# Patient Record
Sex: Male | Born: 1975 | Race: White | Hispanic: No | Marital: Married | Smoking: Current every day smoker
Health system: Southern US, Community
[De-identification: ages and names within clinical notes are randomized; demographics above are authoritative.]

## PROBLEM LIST (undated history)

## (undated) DIAGNOSIS — T7840XA Allergy, unspecified, initial encounter: Secondary | ICD-10-CM

## (undated) DIAGNOSIS — K4090 Unilateral inguinal hernia, without obstruction or gangrene, not specified as recurrent: Secondary | ICD-10-CM

## (undated) HISTORY — DX: Allergy, unspecified, initial encounter: T78.40XA

## (undated) HISTORY — DX: Unilateral inguinal hernia, without obstruction or gangrene, not specified as recurrent: K40.90

---
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
## 1996-04-05 DIAGNOSIS — K4090 Unilateral inguinal hernia, without obstruction or gangrene, not specified as recurrent: Secondary | ICD-10-CM

## 1996-04-05 HISTORY — DX: Unilateral inguinal hernia, without obstruction or gangrene, not specified as recurrent: K40.90

## 1996-04-05 HISTORY — PX: HERNIA REPAIR: SHX51

## 2000-08-30 ENCOUNTER — Emergency Department (HOSPITAL_COMMUNITY): Admission: EM | Admit: 2000-08-30 | Discharge: 2000-08-31 | Payer: Self-pay | Admitting: Emergency Medicine

## 2004-08-12 ENCOUNTER — Ambulatory Visit: Payer: Self-pay | Admitting: Internal Medicine

## 2005-05-14 ENCOUNTER — Emergency Department (HOSPITAL_COMMUNITY): Admission: EM | Admit: 2005-05-14 | Discharge: 2005-05-14 | Payer: Self-pay | Admitting: Emergency Medicine

## 2007-03-24 IMAGING — US US SCROTUM
1 series · 14 of 25 positions shown · non-contrast
Comparison: none

CLINICAL DATA: Left testicular pain.  
 SCROTAL ULTRASOUND:

 DOPPLER ULTRASOUND OF THE TESTICLES:
TECHNIQUE: Complete ultrasound examination of the testicles, epididymis, and other scrotal structures was performed.  Color and spectral Doppler ultrasound were also utilized to evaluate blood flow to the testicles.

[Series 1: unknown · 0.09mm/px · 14 of 45 slices shown]
[im 1/45]
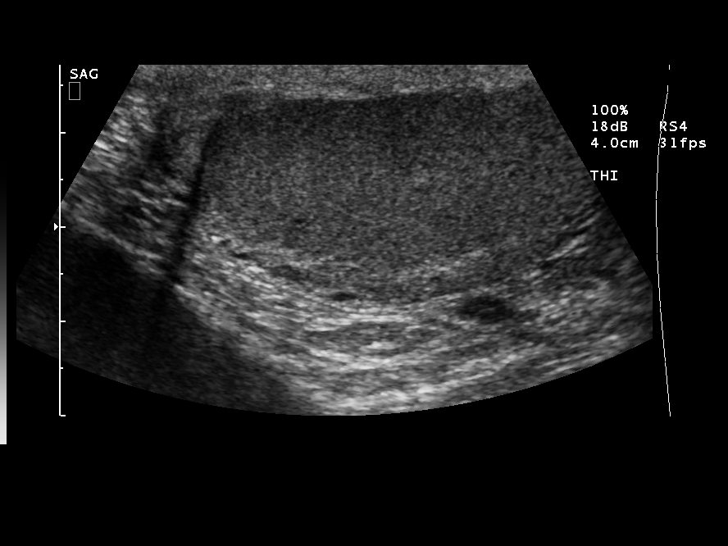
[im 4/45]
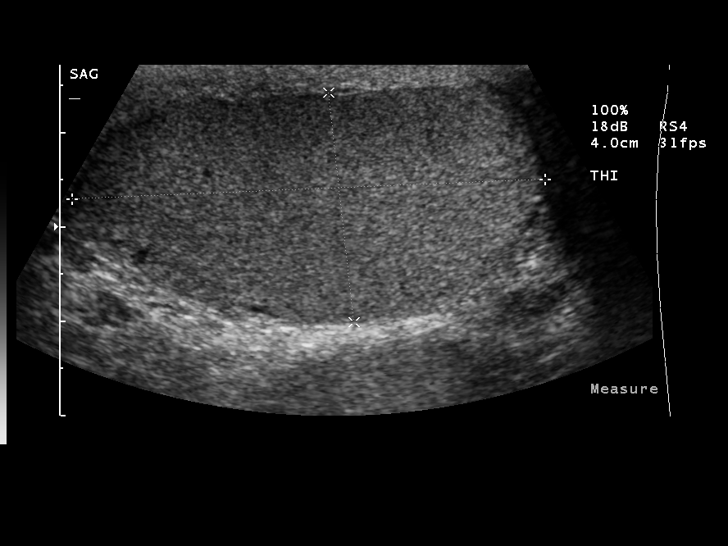
[im 8/45]
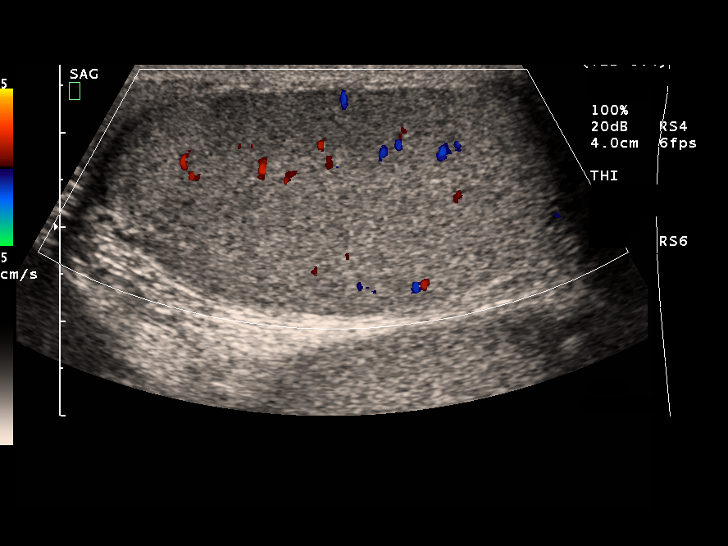
[im 12/45]
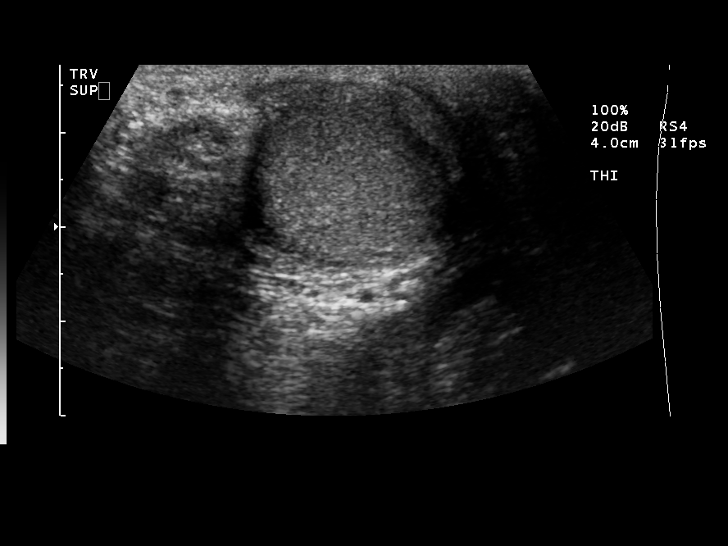
[im 15/45]
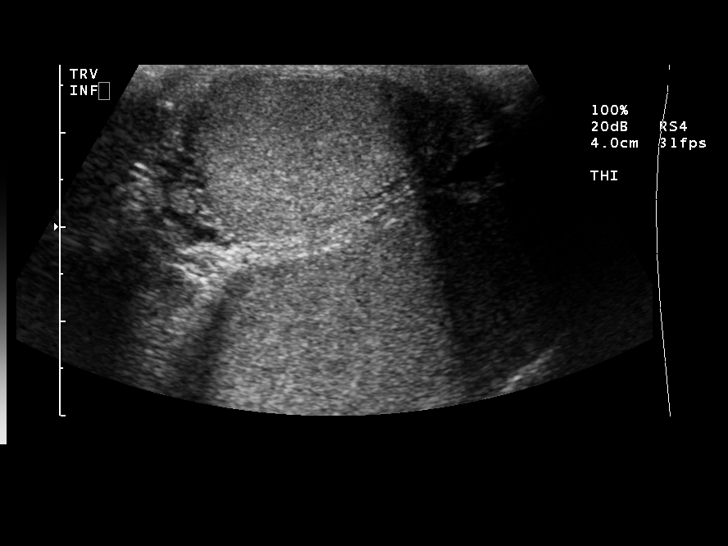
[im 17/45]
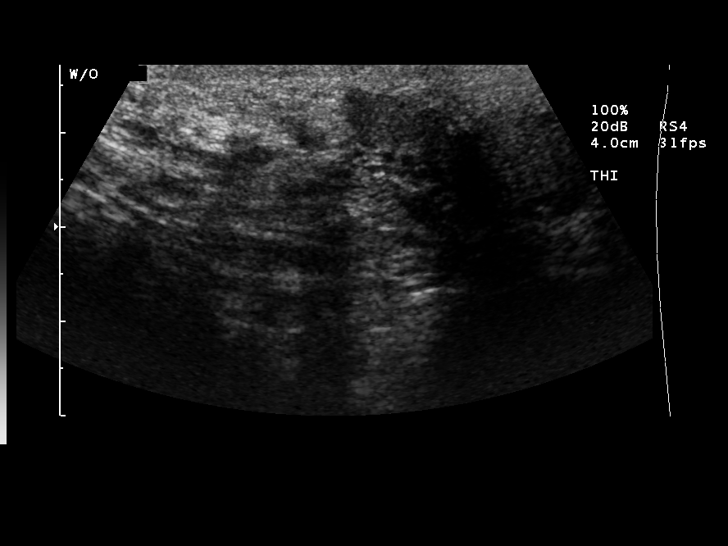
[im 21/45]
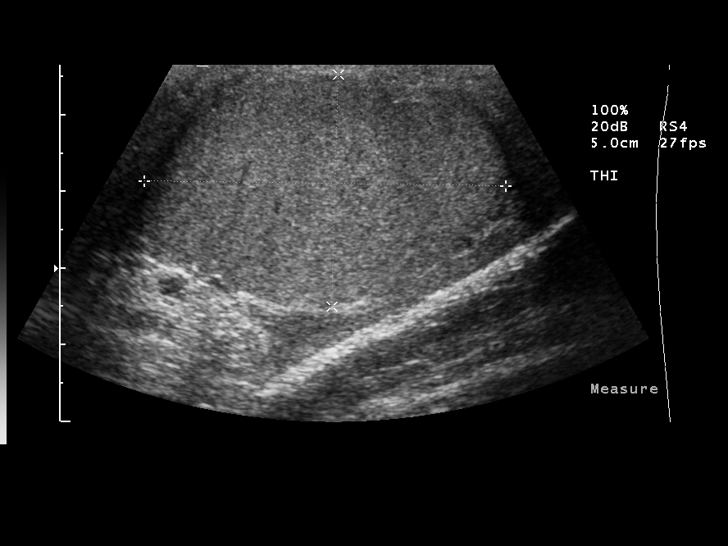
[im 24/45]
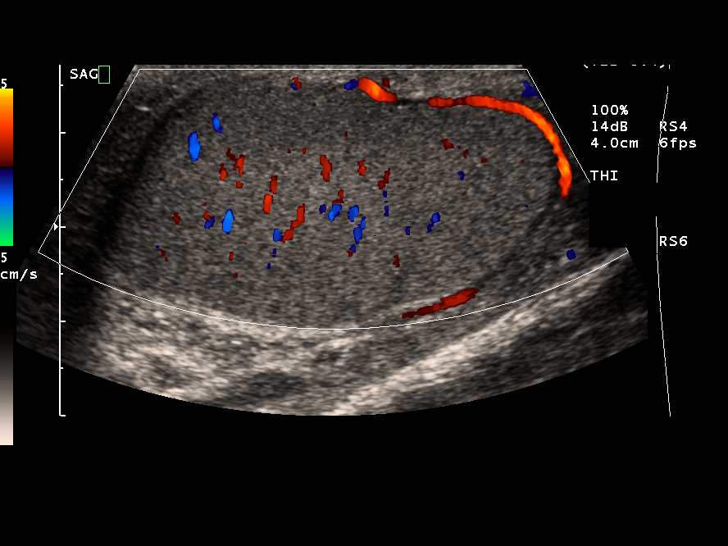
[im 28/45]
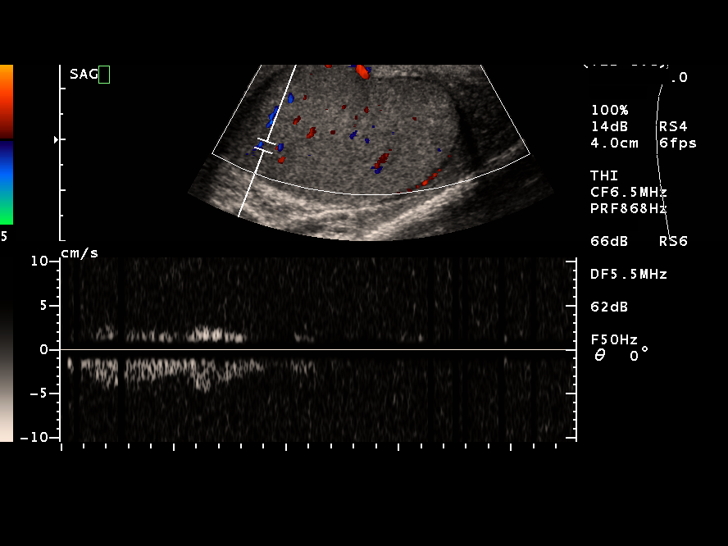
[im 30/45]
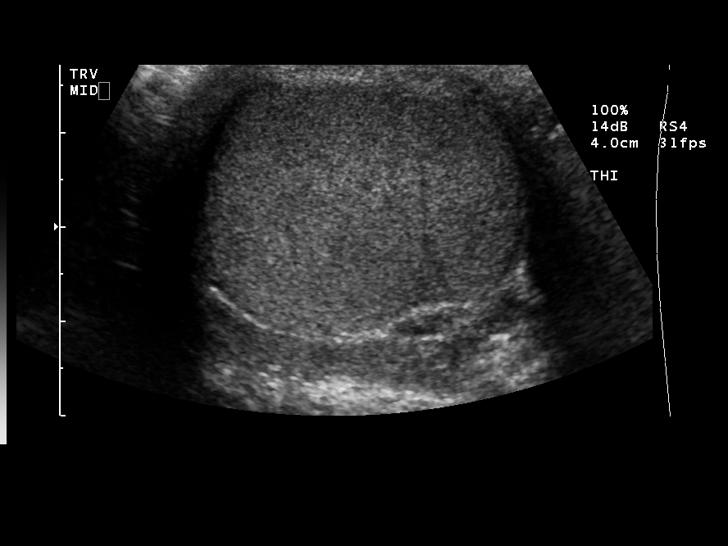
[im 34/45]
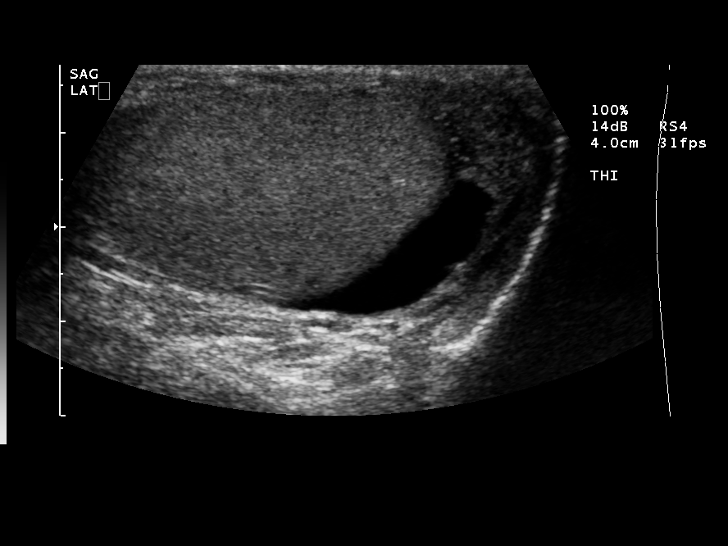
[im 37/45]
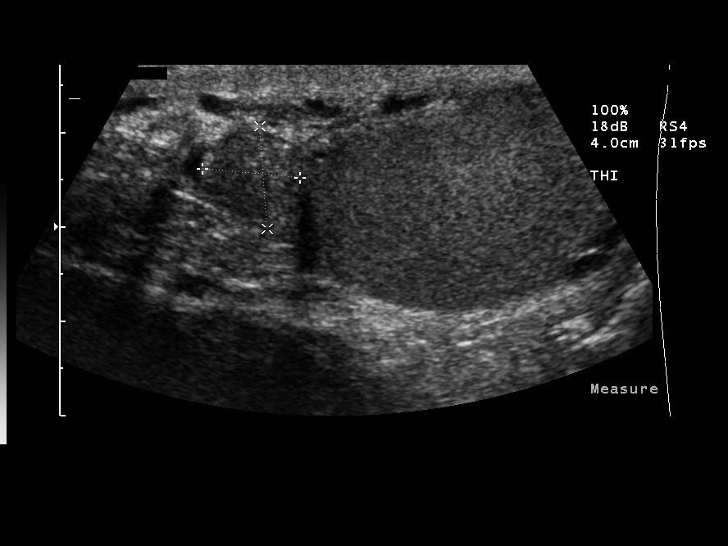
[im 41/45]
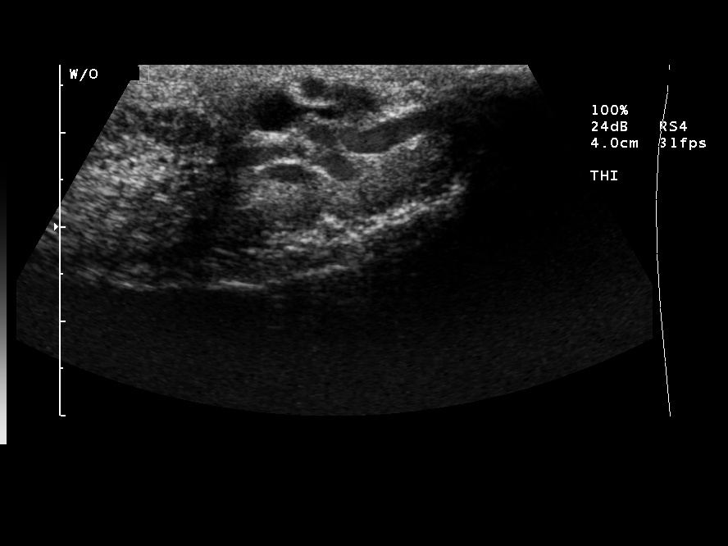
[im 45/45]
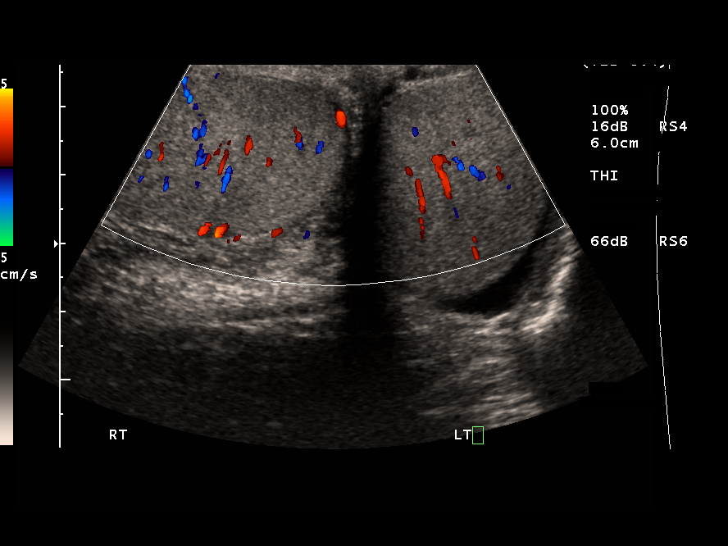

[14 of 25 positions shown; findings below may reference images not displayed]

FINDINGS: Testicles are within normal limits in size and contour with no hypo or hyperechoic masses.  There is symmetrical color Doppler flow and normal arterial and venous waveforms.  There is a small extratesticular calcification on the left that is nonspecific.  There is also a small amount of fluid around the left testicle.  A moderate left varicocele is noted that augments with Valsalva.  Epididymis within normal limits.
IMPRESSION: 1.  Left variocele.
 2.  Small left hydrocele. 
 3.  Nonspecific small scrotal calcification on the left.  
 4.  No other acute or significant findings.

## 2007-03-24 IMAGING — CT CT ABDOMEN W/O CM
1 of 2 series · 15 of 32 positions shown, 19 images · IV contrast (agent unspecified)
Comparison: None.

CLINICAL DATA: Left flank pain.
 ABDOMEN CT WITHOUT CONTRAST:
TECHNIQUE: Multidetector CT imaging of the abdomen was performed following the standard stone protocol without IV or oral contrast.
TECHNIQUE: Multidetector CT imaging of the pelvis was performed following the standard protocol without IV contrast.

[Series 2: renal stone · axial · 0.70mm/px · z∈[-412,-67]mm · 15 of 78 slices shown, 19 images]
[im 6/78  soft-tissue]
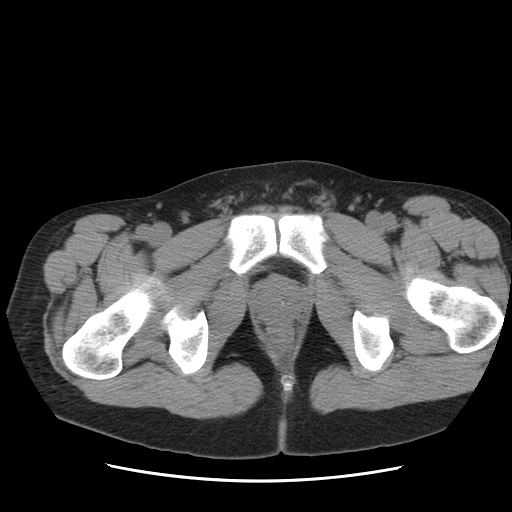
[im 6/78  bone]
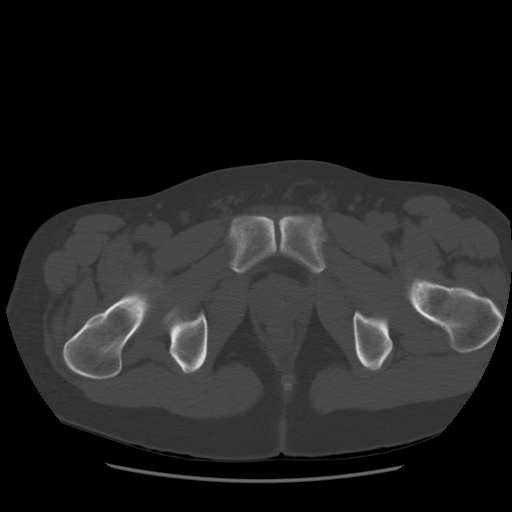
[im 12/78  soft-tissue]
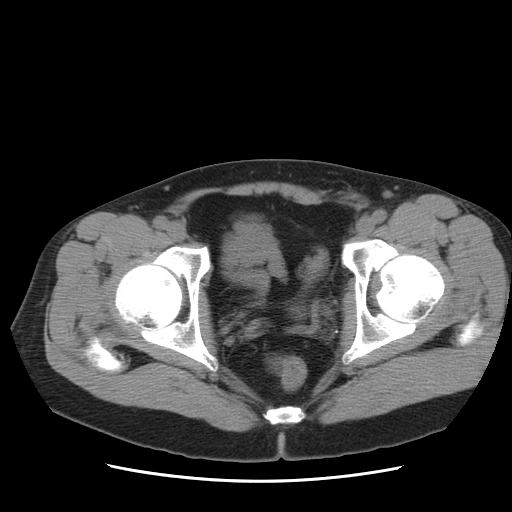
[im 17/78  soft-tissue]
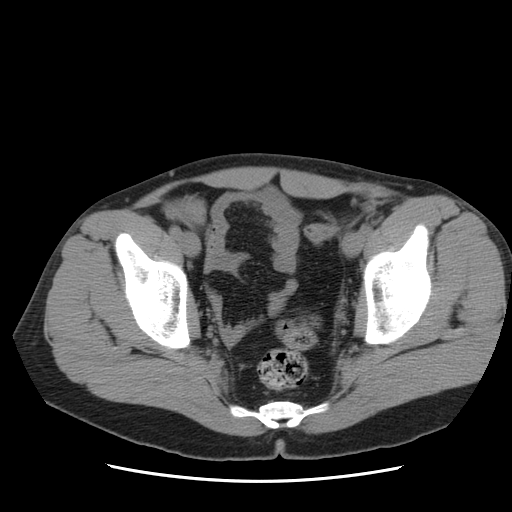
[im 23/78  soft-tissue]
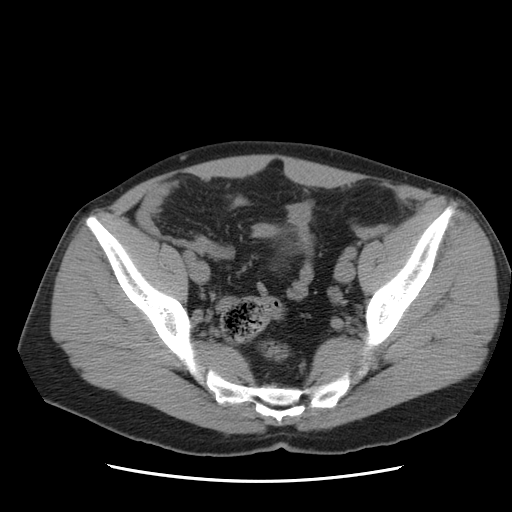
[im 28/78  soft-tissue]
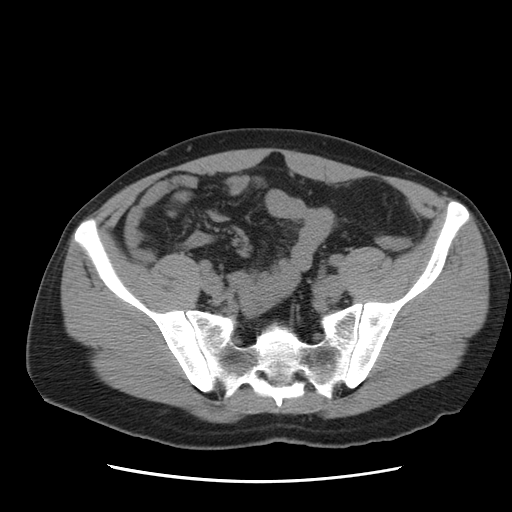
[im 34/78  soft-tissue]
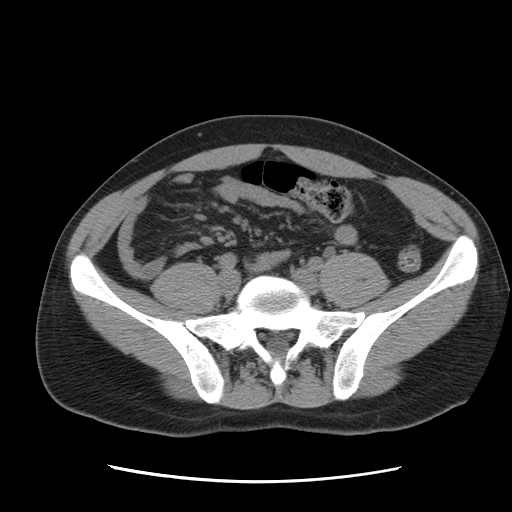
[im 39/78  soft-tissue]
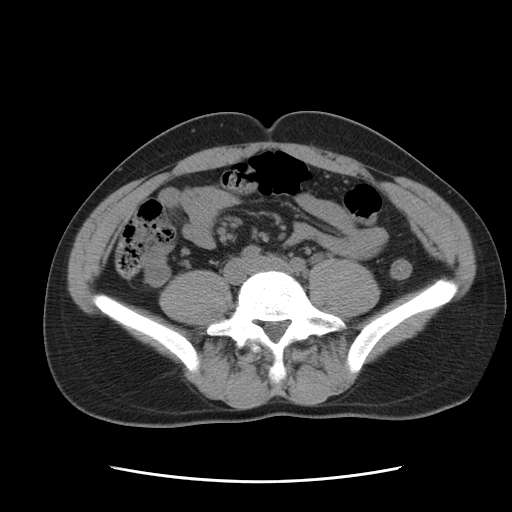
[im 45/78  soft-tissue]
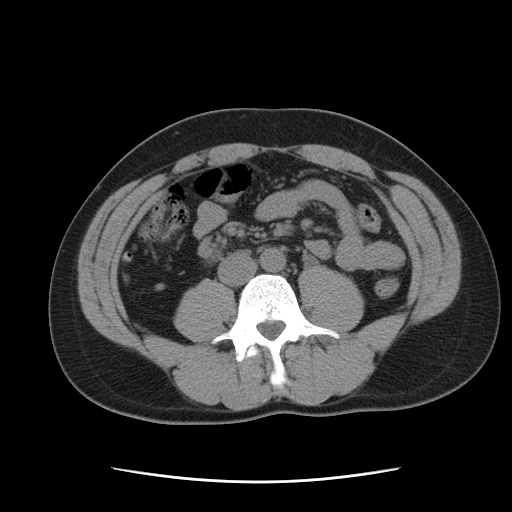
[im 50/78  soft-tissue]
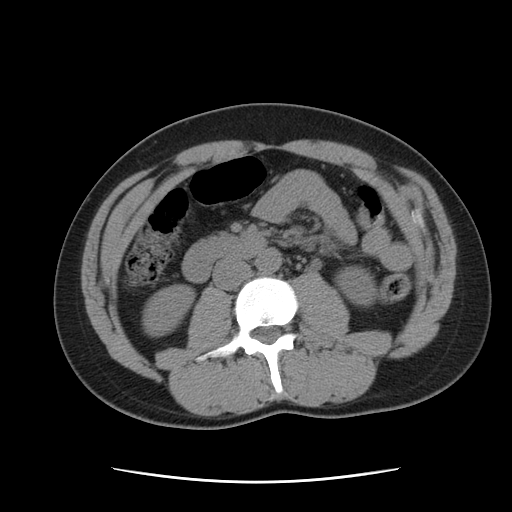
[im 50/78  bone]
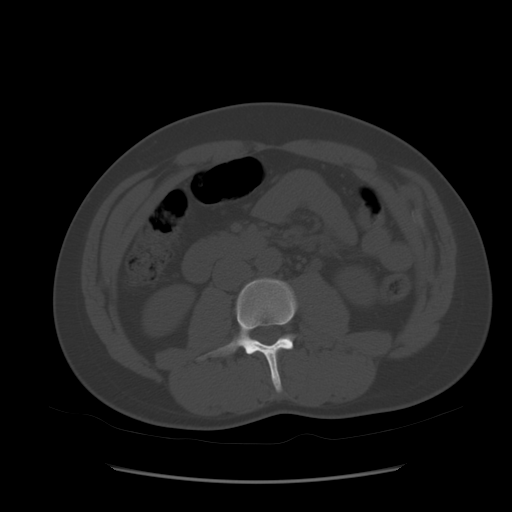
[im 56/78  soft-tissue]
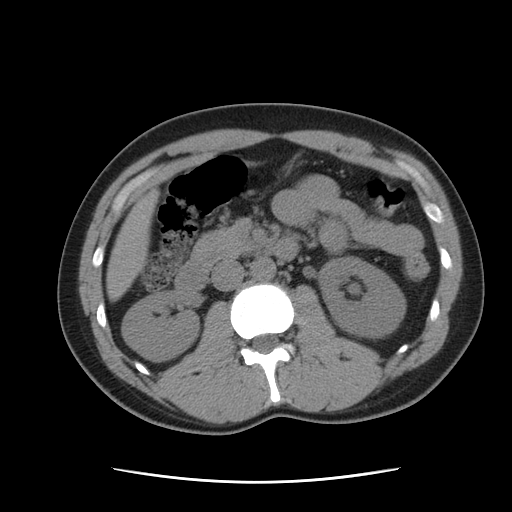
[im 61/78  soft-tissue]
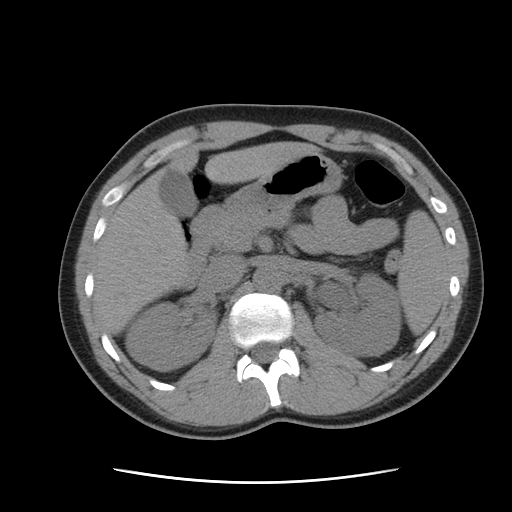
[im 67/78  soft-tissue]
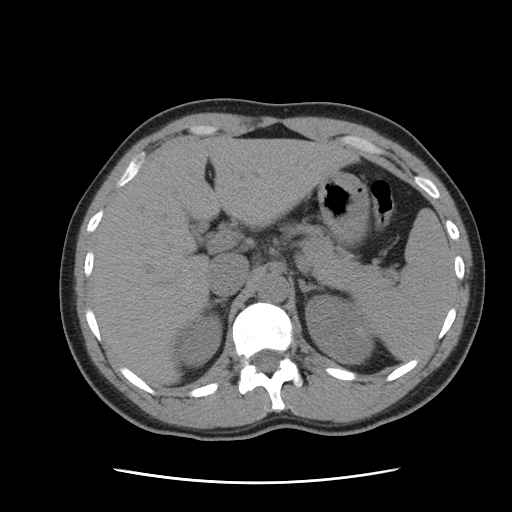
[im 67/78  lung]
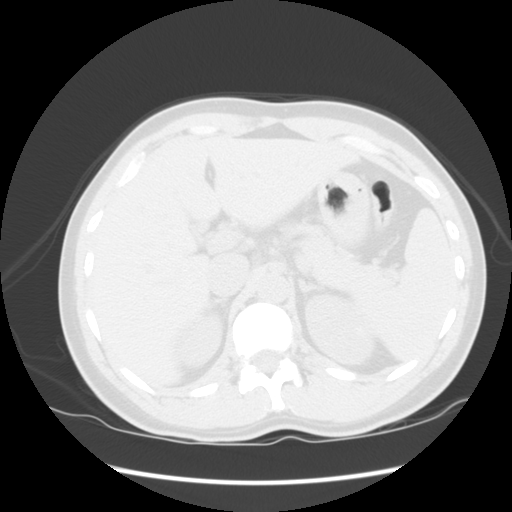
[im 69/78  lung]
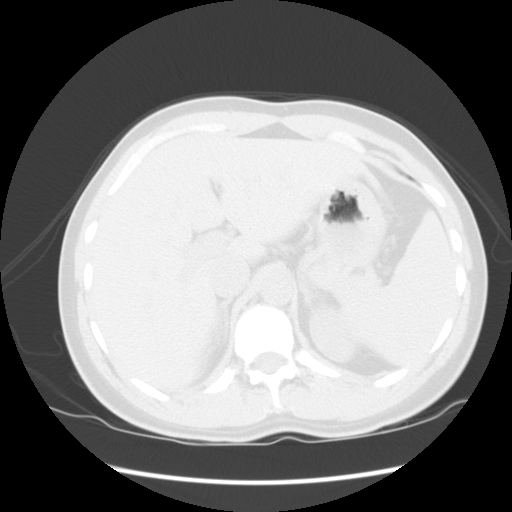
[im 72/78  soft-tissue]
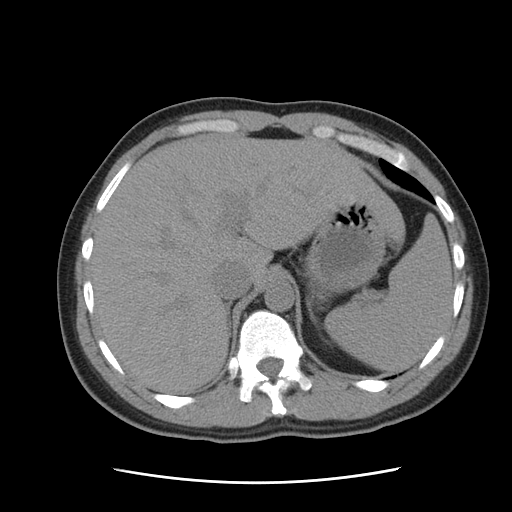
[im 72/78  lung]
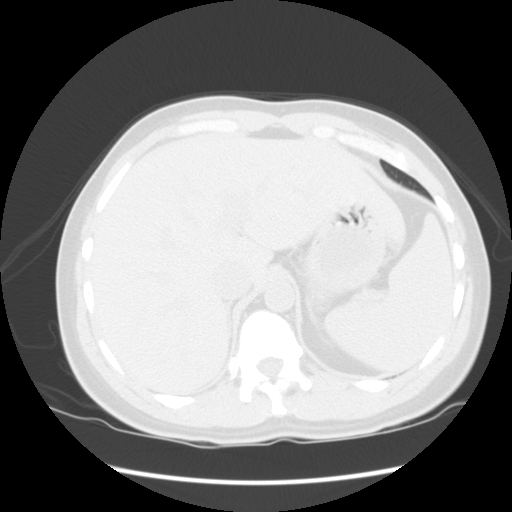
[im 75/78  lung]
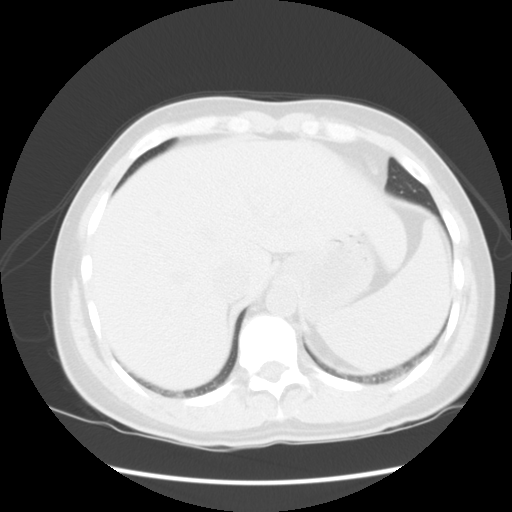

[15 of 32 positions shown; findings below may reference images not displayed]

FINDINGS: In the superior outer aspect of the left kidney, there is a small hyperdense lesion most likely representing a calcified or hyperdense small renal cyst.  There is a second posterior left mid renal cyst.  There is one small left renal calculus.  There are no right renal calculi.  There is mild to moderate left hydronephrosis and hydroureter.  Other viscera are unremarkable given the limitations of scanning without oral or IV contrast.  The cecum and appendix are high in position and are normal.
IMPRESSION: 1.  Left hydronephrosis and hydroureter.
 2.  No other findings of significance.
 PELVIS CT WITHOUT CONTRAST:
FINDINGS: A moderately dilated left ureter can be followed down to the bladder where there is a 5 mm triangular shaped stone in the distal ureter.  The right ureter is unremarkable.  No pelvic masses or fluid collections.
IMPRESSION: 5 mm left distal ureter stone with moderate obstructive uropathy.

## 2014-09-18 DIAGNOSIS — J329 Chronic sinusitis, unspecified: Secondary | ICD-10-CM | POA: Insufficient documentation

## 2014-09-18 DIAGNOSIS — F32A Depression, unspecified: Secondary | ICD-10-CM | POA: Insufficient documentation

## 2014-09-19 ENCOUNTER — Encounter: Payer: Self-pay | Admitting: *Deleted

## 2014-10-03 ENCOUNTER — Ambulatory Visit (INDEPENDENT_AMBULATORY_CARE_PROVIDER_SITE_OTHER): Payer: Commercial Managed Care - HMO | Admitting: General Surgery

## 2014-10-03 ENCOUNTER — Encounter: Payer: Self-pay | Admitting: General Surgery

## 2014-10-03 VITALS — BP 130/70 | HR 78 | Resp 12 | Ht 72.0 in | Wt 180.0 lb

## 2014-10-03 DIAGNOSIS — K429 Umbilical hernia without obstruction or gangrene: Secondary | ICD-10-CM | POA: Diagnosis not present

## 2014-10-03 NOTE — Progress Notes (Signed)
Patient ID: BOLUWATIFE FLIGHT, male   DOB: [birth year], 39 y.o.   MRN: [id number]  Chief Complaint  Patient presents with  . Hernia    HPI Edward Castillo is a 39 y.o. male.  Here today for evaluation of possible umbilical hernia. He states he has noticed a little bulge at his belly button for about a month. Denies pain or trauma to the area. Bowels move daily and normal.   HPI  Past Medical History  Diagnosis Date  . Allergy   . Inguinal hernia 1998    Past Surgical History  Procedure Laterality Date  . Hernia repair Left 1998    [facility]    No family history on file.  Social History History  Substance Use Topics  . Smoking status: Current Every Day Smoker -- 1.00 packs/day for 18 years    Types: Cigarettes  . Smokeless tobacco: Never Used  . Alcohol Use: No    Allergies  Allergen Reactions  . Sulfa Antibiotics Other (See Comments)    unknown    Current Outpatient Prescriptions  Medication Sig Dispense Refill  . azelastine (ASTELIN) 0.1 % nasal spray Place 2 sprays into both nostrils as needed.   11  . fluticasone (FLONASE) 50 MCG/ACT nasal spray Place 2 sprays into both nostrils as needed.   12   No current facility-administered medications for this visit.    Review of Systems Review of Systems  Constitutional: Negative.   Respiratory: Negative.   Cardiovascular: Negative.     Blood pressure 130/70, pulse 78, resp. rate 12, height 6' (1.829 m), weight 180 lb (81.647 kg).  Physical Exam Physical Exam  Constitutional: He is oriented to person, place, and time. He appears well-developed and well-nourished.  HENT:  Mouth/Throat: Oropharynx is clear and moist.  Eyes: Conjunctivae are normal. No scleral icterus.  Neck: Neck supple.  Cardiovascular: Normal rate, regular rhythm and normal heart sounds.   Pulmonary/Chest: Effort normal and breath sounds normal.  Abdominal: Soft. Normal appearance. There is no tenderness. A hernia is present. Hernia  confirmed negative in the right inguinal area and confirmed negative in the left inguinal area.  Tiny umbilical hernia.  Lymphadenopathy:    He has no cervical adenopathy.  Neurological: He is alert and oriented to person, place, and time.  Skin: Skin is warm and dry.  Psychiatric: He has a normal mood and affect.    Data Reviewed PCP notes of 09/18/2014.  Office notes of 08/30/2003. (Patient was seen for possible recurrent left inguinal hernia, muscle strain diagnosed).  Assessment    Tiny, minimally symptomatic umbilical hernia.    Plan    Pros and cons of elective repair were reviewed. Natural history of hernia defects is to increase stepwise fashion over time. No indication for urgent repair. It is unlikely he would require mesh placement based on its current size.     Hernia precautions and incarceration were discussed with the patient. If they develop symptoms of an incarcerated hernia, they were encouraged to seek prompt medical attention.  The patient will consider his options and notify the office if he would like to proceed to surgical repair.    PCP/Ref: Teena Irani 10/04/2014, 7:15 PM

## 2014-10-03 NOTE — Patient Instructions (Signed)

## 2014-10-04 DIAGNOSIS — K429 Umbilical hernia without obstruction or gangrene: Secondary | ICD-10-CM | POA: Insufficient documentation

## 2014-10-27 ENCOUNTER — Encounter: Payer: Self-pay | Admitting: Emergency Medicine

## 2014-10-27 ENCOUNTER — Emergency Department
Admission: EM | Admit: 2014-10-27 | Discharge: 2014-10-27 | Disposition: A | Discharge status: Home / Self Care | Payer: Commercial Managed Care - HMO | Attending: Emergency Medicine | Admitting: Emergency Medicine

## 2014-10-27 DIAGNOSIS — K088 Other specified disorders of teeth and supporting structures: Secondary | ICD-10-CM | POA: Insufficient documentation

## 2014-10-27 DIAGNOSIS — K0381 Cracked tooth: Secondary | ICD-10-CM | POA: Insufficient documentation

## 2014-10-27 DIAGNOSIS — Z720 Tobacco use: Secondary | ICD-10-CM | POA: Diagnosis not present

## 2014-10-27 DIAGNOSIS — K0889 Other specified disorders of teeth and supporting structures: Secondary | ICD-10-CM

## 2014-10-27 MED ORDER — PENICILLIN V POTASSIUM 500 MG PO TABS: 500.0000 mg | ORAL_TABLET | Freq: Four times a day (QID) | ORAL | Status: DC

## 2014-10-27 MED ORDER — TRAMADOL HCL 50 MG PO TABS: 50.0000 mg | ORAL_TABLET | Freq: Four times a day (QID) | ORAL | Status: AC | PRN

## 2014-10-27 MED ORDER — TRAMADOL HCL 50 MG PO TABS
100.0000 mg | ORAL_TABLET | Freq: Once | ORAL | Status: AC
  Administered 2014-10-27: 100 mg via ORAL
  Filled 2014-10-27: qty 2

## 2014-10-27 NOTE — Discharge Instructions (Signed)

## 2014-10-27 NOTE — ED Notes (Signed)
Pt says he has a cracked tooth on the bottom right side of his mouth; intermittent pain started about a month ago; constant pain for the last 5 hours; took ibuprofen, tylenol and generic OTC sinus medication tonight with no relief of pain

## 2014-10-27 NOTE — ED Provider Notes (Signed)
[facility] Emergency Department Provider Note  Time seen: 5:56 AM  I have reviewed the triage vital signs and the nursing notes.   HISTORY  Chief Complaint Dental Pain    HPI Edward Castillo is a 39 y.o. male who presents the emergency department with dental pain. According to the patient he has a cracked right lower molar. For the past one month he has had intermittent pain in that tooth. However today he has had constant significant pain to that area. Denies any fever. Denies any nausea or vomiting. Symptoms are much worse if he attempts to chew.     Past Medical History  Diagnosis Date  . Allergy   . Inguinal hernia 1998    Patient Active Problem List   Diagnosis Date Noted  . Umbilical hernia without obstruction and without gangrene 10/04/2014    Past Surgical History  Procedure Laterality Date  . Hernia repair Left 1998    [facility]    Current Outpatient Rx  Name  Route  Sig  Dispense  Refill  . azelastine (ASTELIN) 0.1 % nasal spray   Each Nare   Place 2 sprays into both nostrils as needed.       11   . fluticasone (FLONASE) 50 MCG/ACT nasal spray   Each Nare   Place 2 sprays into both nostrils as needed.       12     Allergies Sulfa antibiotics  History reviewed. No pertinent family history.  Social History History  Substance Use Topics  . Smoking status: Current Every Day Smoker -- 1.00 packs/day for 18 years    Types: Cigarettes  . Smokeless tobacco: Never Used  . Alcohol Use: No    Review of Systems Constitutional: Negative for fever. ENT: Positive for right tooth pain. Cardiovascular: Negative for chest pain. Respiratory: Negative for shortness of breath. Gastrointestinal: Negative for abdominal pain  10-point ROS otherwise negative.  ____________________________________________   PHYSICAL EXAM:  VITAL SIGNS: ED Triage Vitals  Enc Vitals Group     BP 10/27/14 0429 133/86 mmHg     Pulse  Rate 10/27/14 0429 64     Resp 10/27/14 0429 18     Temp 10/27/14 0429 97.7 F (36.5 C)     Temp Source 10/27/14 0429 Oral     SpO2 10/27/14 0429 96 %     Weight 10/27/14 0429 183 lb (83.008 kg)     Height 10/27/14 0429 6' (1.829 m)     Head Cir --      Peak Flow --      Pain Score 10/27/14 0430 6     Pain Loc --      Pain Edu? --      Excl. in [place]? --     Constitutional: Alert and oriented. Well appearing and in no distress. ENT   Mouth/Throat: Patient has a fractured right lower molar. No signs of abscess. Patient does have tenderness to tooth tapping. Cardiovascular: Normal rate, regular rhythm. No murmur Respiratory: Normal respiratory effort without tachypnea nor retractions. Breath sounds are clear Gastrointestinal: Soft and nontender. No distention.   Musculoskeletal: Nontender with normal range of motion in all extremities.  Neurologic:  Normal speech and language. No gross focal neurologic deficits  Skin:  Skin is warm, dry and intact.  Psychiatric: Mood and affect are normal. Speech and behavior are normal.  ____________________________________________   INITIAL IMPRESSION / ASSESSMENT AND PLAN / ED COURSE  Pertinent labs & imaging results that were  available during my care of the patient were reviewed by me and considered in my medical decision making (see chart for details).  Patient with right lower molar dental pain. No sign of abscess on exam. Patient does have a fractured tooth on exam. We will treat with penicillin, and Ultram, and have the patient follow-up with his primary care doctor as well as dentist. Patient is agreeable.  ____________________________________________   FINAL CLINICAL IMPRESSION(S) / ED DIAGNOSES  Dental pain   Harvest Dark, MD 10/27/14 [phone]

## 2016-11-07 DIAGNOSIS — J0100 Acute maxillary sinusitis, unspecified: Secondary | ICD-10-CM | POA: Diagnosis not present

## 2017-04-06 DIAGNOSIS — Z0000 Encounter for general adult medical examination without abnormal findings: Secondary | ICD-10-CM | POA: Diagnosis not present

## 2017-04-06 DIAGNOSIS — J309 Allergic rhinitis, unspecified: Secondary | ICD-10-CM | POA: Diagnosis not present

## 2017-04-06 DIAGNOSIS — Z720 Tobacco use: Secondary | ICD-10-CM | POA: Diagnosis not present

## 2017-04-28 DIAGNOSIS — R5383 Other fatigue: Secondary | ICD-10-CM | POA: Diagnosis not present

## 2017-04-28 DIAGNOSIS — R52 Pain, unspecified: Secondary | ICD-10-CM | POA: Diagnosis not present

## 2017-05-01 DIAGNOSIS — R509 Fever, unspecified: Secondary | ICD-10-CM | POA: Diagnosis not present

## 2017-05-01 DIAGNOSIS — R6889 Other general symptoms and signs: Secondary | ICD-10-CM | POA: Diagnosis not present

## 2017-05-01 DIAGNOSIS — R05 Cough: Secondary | ICD-10-CM | POA: Diagnosis not present

## 2017-05-31 DIAGNOSIS — J181 Lobar pneumonia, unspecified organism: Secondary | ICD-10-CM | POA: Diagnosis not present

## 2017-09-18 DIAGNOSIS — J029 Acute pharyngitis, unspecified: Secondary | ICD-10-CM | POA: Diagnosis not present

## 2018-02-28 DIAGNOSIS — L578 Other skin changes due to chronic exposure to nonionizing radiation: Secondary | ICD-10-CM | POA: Diagnosis not present

## 2018-02-28 DIAGNOSIS — D239 Other benign neoplasm of skin, unspecified: Secondary | ICD-10-CM | POA: Diagnosis not present

## 2018-02-28 DIAGNOSIS — D485 Neoplasm of uncertain behavior of skin: Secondary | ICD-10-CM | POA: Diagnosis not present

## 2021-01-22 ENCOUNTER — Ambulatory Visit: Payer: Commercial Managed Care - HMO | Admitting: Dermatology

## 2021-07-20 ENCOUNTER — Ambulatory Visit (INDEPENDENT_AMBULATORY_CARE_PROVIDER_SITE_OTHER): Payer: No Typology Code available for payment source | Admitting: Dermatology

## 2021-07-20 DIAGNOSIS — L814 Other melanin hyperpigmentation: Secondary | ICD-10-CM | POA: Diagnosis not present

## 2021-07-20 DIAGNOSIS — L820 Inflamed seborrheic keratosis: Secondary | ICD-10-CM

## 2021-07-20 DIAGNOSIS — L578 Other skin changes due to chronic exposure to nonionizing radiation: Secondary | ICD-10-CM | POA: Diagnosis not present

## 2021-07-20 NOTE — Progress Notes (Signed)
? ?  New Patient Visit ? ?Subjective  ?Edward Castillo is a 46 y.o. male who presents for the following: Other (Dark spots of face for about couple years that were treated with LN2 in 2020 "but they came back"). ?The patient has spots, moles and lesions to be evaluated, some may be new or changing and the patient has concerns that these could be cancer. ? ?The following portions of the chart were reviewed this encounter and updated as appropriate:  ? Tobacco  Allergies  Meds  Problems  Med Hx  Surg Hx  Fam Hx   ?  ?Review of Systems:  No other skin or systemic complaints except as noted in HPI or Assessment and Plan. ? ?Objective  ?Well appearing patient in no apparent distress; mood and affect are within normal limits. ? ?A focused examination was performed including face. Relevant physical exam findings are noted in the Assessment and Plan. ? ?Face ?Scattered tan macules.  ? ? ? ? ? ? ? ? ? ? ? ? ?Right forehead x 2, left cheek x 1, left sideburn x 1 (4) ?Erythematous stuck-on, waxy papule or plaque ? ? ?Assessment & Plan  ?Lentigines ?Face ? ?Discussed the treatment option of BBL/laser.  Typically we recommend 1-3 treatment sessions about 5-8 weeks apart for best results.  The patient's condition may require "maintenance treatments" in the future.  The fee for BBL / laser treatments is $350 per treatment session for the whole face.  A fee can be quoted for other parts of the body. ?Insurance typically does not pay for BBL/laser treatments and therefore the fee is an out-of-pocket cost. ? ?Advised patient best time to treat is fall/winter when he is not in the sun as much. ? ?Inflamed seborrheic keratosis (4) ?Right forehead x 2, left cheek x 1, left sideburn x 1 ? ?Recheck on follow up. May need additional treatment. ? ?Destruction of lesion - Right forehead x 2, left cheek x 1, left sideburn x 1 ?Complexity: simple   ?Destruction method: cryotherapy   ?Informed consent: discussed and consent obtained    ?Timeout:  patient name, date of birth, surgical site, and procedure verified ?Lesion destroyed using liquid nitrogen: Yes   ?Region frozen until ice ball extended beyond lesion: Yes   ?Outcome: patient tolerated procedure well with no complications   ?Post-procedure details: wound care instructions given   ? ?Actinic Damage ?- chronic, secondary to cumulative UV radiation exposure/sun exposure over time ?- diffuse scaly erythematous macules with underlying dyspigmentation ?- Recommend daily broad spectrum sunscreen SPF 30+ to sun-exposed areas, reapply every 2 hours as needed.  ?- Recommend staying in the shade or wearing long sleeves, sun glasses (UVA+UVB protection) and wide brim hats (4-inch brim around the entire circumference of the hat). ?- Call for new or changing lesions. ? ?Return for BBL in the fall. ? ?I, Ashok Cordia, CMA, am acting as scribe for Sarina Ser, MD . ?Documentation: I have reviewed the above documentation for accuracy and completeness, and I agree with the above. ? ?Sarina Ser, MD ? ?

## 2021-07-20 NOTE — Patient Instructions (Signed)

## 2021-07-24 ENCOUNTER — Encounter: Payer: Self-pay | Admitting: Dermatology

## 2022-02-01 ENCOUNTER — Ambulatory Visit (INDEPENDENT_AMBULATORY_CARE_PROVIDER_SITE_OTHER): Payer: No Typology Code available for payment source | Admitting: Family Medicine

## 2022-02-01 ENCOUNTER — Encounter: Payer: Self-pay | Admitting: Family Medicine

## 2022-02-01 VITALS — BP 128/80 | HR 76 | Ht 72.0 in | Wt 185.0 lb

## 2022-02-01 DIAGNOSIS — J309 Allergic rhinitis, unspecified: Secondary | ICD-10-CM

## 2022-02-01 DIAGNOSIS — Z720 Tobacco use: Secondary | ICD-10-CM | POA: Diagnosis not present

## 2022-02-01 DIAGNOSIS — N529 Male erectile dysfunction, unspecified: Secondary | ICD-10-CM | POA: Diagnosis not present

## 2022-02-01 DIAGNOSIS — Z1211 Encounter for screening for malignant neoplasm of colon: Secondary | ICD-10-CM | POA: Diagnosis not present

## 2022-02-01 MED ORDER — AZELASTINE HCL 0.1 % NA SOLN: 2.0000 | NASAL | 11 refills | Status: DC | PRN

## 2022-02-01 MED ORDER — FLUNISOLIDE 25 MCG/ACT (0.025%) NA SOLN: 2.0000 | Freq: Two times a day (BID) | NASAL | 0 refills | Status: DC

## 2022-02-01 MED ORDER — TADALAFIL 10 MG PO TABS: 10.0000 mg | ORAL_TABLET | ORAL | 1 refills | Status: DC | PRN

## 2022-02-01 NOTE — Assessment & Plan Note (Signed)
Chronic condition, touched on interplay of tobacco and comorbid medical conditions and stated concerns, cessation conversation x3 minutes.

## 2022-02-01 NOTE — Patient Instructions (Addendum)
-   Start Flonase, 2 sprays in each nostril daily - Breakthrough symptoms can be addressed with Astelin nasal spray as needed - If symptoms fail to improve after 2 weeks, discontinue Flonase and start flunisolide, 2 sprays in each nostril twice daily - Referral coordinator will contact you to schedule follow-ups with ENT/sleep medicine and gastroenterology/colonoscopy - Review information attached - Return for annual physical early 04/2022

## 2022-02-01 NOTE — Assessment & Plan Note (Signed)
Chronic condition, acutely worsened over the past season, interfering with sleep, having nighttime awakenings, daytime somnolence.  Infrequently uses OTC medications with mixed response.  Examination reveals mildly erythematous and swollen right turbinate, left turbinate with erythema, oropharynx benign, tympanic membranes partially obscured with dark cerumen, visualized TM benign, canal benign, nontender sinuses, no lymphadenopathy.  Lung fields initially with coarse air entry, after repetitive coughing, this readily clears.  Benign cardiac sounds.  Given patient's stated history and symptoms, concern for uncontrolled chronic allergic rhinitis and concern for obstructive sleep apnea.  Referral to ENT/sleep medicine placed for both issues, interim measures include scheduled Flonase x2 weeks, low threshold to advance to flunisolide, as needed uses of Astelin.

## 2022-02-01 NOTE — Assessment & Plan Note (Signed)
Referral to gastroenterology for colonoscopy placed today.

## 2022-02-01 NOTE — Assessment & Plan Note (Signed)
Chronic condition partially addressed with Cialis 5 mg in the past, in the setting of tobacco usage, limited exercise and suboptimal lifestyle per patient's account.  Plan for risk stratification labs, discussed the correlation with tobacco usage and cardiovascular health, patient information provided.  Additionally, Cialis 10 mg prescribed today.

## 2022-02-01 NOTE — Progress Notes (Signed)
Primary Care / Sports Medicine Office Visit  Patient Information:  Patient ID: MARCIANO MUNDT, male DOB: 24-Jan-1976 Age: 46 y.o. MRN: [id number]   KEDRON UNO is a pleasant 45 y.o. male presenting with the following:  Chief Complaint  Patient presents with   Establish Care    Vitals:   02/01/22 1339  BP: 128/80  Pulse: 76  SpO2: 99%   Vitals:   02/01/22 1339  Weight: 185 lb (83.9 kg)  Height: 6' (1.829 m)   Body mass index is 25.09 kg/m.  No results found.   Independent interpretation of notes and tests performed by another provider:   None  Procedures performed:   None  Pertinent History, Exam, Impression, and Recommendations:   Problem List Items Addressed This Visit       Respiratory   Chronic allergic rhinitis - Primary    Chronic condition, acutely worsened over the past season, interfering with sleep, having nighttime awakenings, daytime somnolence.  Infrequently uses OTC medications with mixed response.  Examination reveals mildly erythematous and swollen right turbinate, left turbinate with erythema, oropharynx benign, tympanic membranes partially obscured with dark cerumen, visualized TM benign, canal benign, nontender sinuses, no lymphadenopathy.  Lung fields initially with coarse air entry, after repetitive coughing, this readily clears.  Benign cardiac sounds.  Given patient's stated history and symptoms, concern for uncontrolled chronic allergic rhinitis and concern for obstructive sleep apnea.  Referral to ENT/sleep medicine placed for both issues, interim measures include scheduled Flonase x2 weeks, low threshold to advance to flunisolide, as needed uses of Astelin.      Relevant Medications   azelastine (ASTELIN) 0.1 % nasal spray   flunisolide (NASALIDE) 25 MCG/ACT (0.025%) SOLN   Other Relevant Orders   Ambulatory referral to ENT     Other   Erectile dysfunction    Chronic condition partially addressed with Cialis 5 mg in the past,  in the setting of tobacco usage, limited exercise and suboptimal lifestyle per patient's account.  Plan for risk stratification labs, discussed the correlation with tobacco usage and cardiovascular health, patient information provided.  Additionally, Cialis 10 mg prescribed today.      Relevant Medications   tadalafil (CIALIS) 10 MG tablet   Screening for colon cancer    Referral to gastroenterology for colonoscopy placed today.      Relevant Orders   Ambulatory referral to Gastroenterology   Tobacco use    Chronic condition, touched on interplay of tobacco and comorbid medical conditions and stated concerns, cessation conversation x3 minutes.        Orders & Medications Meds ordered this encounter  Medications   tadalafil (CIALIS) 10 MG tablet    Sig: Take 1 tablet (10 mg total) by mouth every other day as needed for erectile dysfunction.    Dispense:  10 tablet    Refill:  1   azelastine (ASTELIN) 0.1 % nasal spray    Sig: Place 2 sprays into both nostrils as needed.    Dispense:  30 mL    Refill:  11   flunisolide (NASALIDE) 25 MCG/ACT (0.025%) SOLN    Sig: Place 2 sprays into the nose 2 (two) times daily.    Dispense:  25 mL    Refill:  0   Orders Placed This Encounter  Procedures   Ambulatory referral to ENT   Ambulatory referral to Gastroenterology     Return in about 9 weeks (around 04/05/2022) for CPE.     Earley Abide  Zigmund Daniel, MD   [place]

## 2022-02-02 ENCOUNTER — Other Ambulatory Visit: Payer: Self-pay

## 2022-02-02 ENCOUNTER — Telehealth: Payer: Self-pay

## 2022-02-02 DIAGNOSIS — Z1211 Encounter for screening for malignant neoplasm of colon: Secondary | ICD-10-CM

## 2022-02-02 MED ORDER — NA SULFATE-K SULFATE-MG SULF 17.5-3.13-1.6 GM/177ML PO SOLN: 1.0000 | Freq: Once | ORAL | 0 refills | Status: AC

## 2022-02-02 NOTE — Telephone Encounter (Signed)
Gastroenterology Pre-Procedure Review  Request Date: 03/12/22 Requesting Physician: Dr. Marius Ditch  PATIENT REVIEW QUESTIONS: The patient responded to the following health history questions as indicated:    1. Are you having any GI issues? no 2. Do you have a personal history of Polyps? no 3. Do you have a family history of Colon Cancer or Polyps? no 4. Diabetes Mellitus? no 5. Joint replacements in the past 12 months?no 6. Major health problems in the past 3 months?no 7. Any artificial heart valves, MVP, or defibrillator?no    MEDICATIONS & ALLERGIES:    Patient reports the following regarding taking any anticoagulation/antiplatelet therapy:   Plavix, Coumadin, Eliquis, Xarelto, Lovenox, Pradaxa, Brilinta, or Effient? no Aspirin? no  Patient confirms/reports the following medications:  Current Outpatient Medications  Medication Sig Dispense Refill   azelastine (ASTELIN) 0.1 % nasal spray Place 2 sprays into both nostrils as needed. 30 mL 11   flunisolide (NASALIDE) 25 MCG/ACT (0.025%) SOLN Place 2 sprays into the nose 2 (two) times daily. 25 mL 0   tadalafil (CIALIS) 10 MG tablet Take 1 tablet (10 mg total) by mouth every other day as needed for erectile dysfunction. 10 tablet 1   No current facility-administered medications for this visit.    Patient confirms/reports the following allergies:  Allergies  Allergen Reactions   Sulfa Antibiotics Other (See Comments)    unknown    No orders of the defined types were placed in this encounter.   AUTHORIZATION INFORMATION Primary Insurance: 1D#: Group #:  Secondary Insurance: 1D#: Group #:  SCHEDULE INFORMATION: Date: 03/12/22 Time: Location: [facility]

## 2022-02-18 ENCOUNTER — Ambulatory Visit (INDEPENDENT_AMBULATORY_CARE_PROVIDER_SITE_OTHER): Payer: Self-pay | Admitting: Dermatology

## 2022-02-18 DIAGNOSIS — L814 Other melanin hyperpigmentation: Secondary | ICD-10-CM

## 2022-02-18 NOTE — Patient Instructions (Signed)
Due to recent changes in healthcare laws, you may see results of your pathology and/or laboratory studies on MyChart before the doctors have had a chance to review them. We understand that in some cases there may be results that are confusing or concerning to you. Please understand that not all results are received at the same time and often the doctors may need to interpret multiple results in order to provide you with the best plan of care or course of treatment. Therefore, we ask that you please give us 2 business days to thoroughly review all your results before contacting the office for clarification. Should we see a critical lab result, you will be contacted sooner.   If You Need Anything After Your Visit  If you have any questions or concerns for your doctor, please call our main line at [phone] and press option 4 to reach your doctor's medical assistant. If no one answers, please leave a voicemail as directed and we will return your call as soon as possible. Messages left after 4 pm will be answered the following business day.   You may also send us a message via MyChart. We typically respond to MyChart messages within 1-2 business days.  For prescription refills, please ask your pharmacy to contact our office. Our fax number is [phone].  If you have an urgent issue when the clinic is closed that cannot wait until the next business day, you can page your doctor at the number below.    Please note that while we do our best to be available for urgent issues outside of office hours, we are not available 24/7.   If you have an urgent issue and are unable to reach us, you may choose to seek medical care at your doctor's office, retail clinic, urgent care center, or emergency room.  If you have a medical emergency, please immediately call 911 or go to the emergency department.  Pager Numbers  - Dr. Kowalski: [phone]  - Dr. Moye: [phone]  - Dr. Stewart:  [phone]  In the event of inclement weather, please call our main line at [phone] for an update on the status of any delays or closures.  Dermatology Medication Tips: Please keep the boxes that topical medications come in in order to help keep track of the instructions about where and how to use these. Pharmacies typically print the medication instructions only on the boxes and not directly on the medication tubes.   If your medication is too expensive, please contact our office at [phone] option 4 or send us a message through MyChart.   We are unable to tell what your co-pay for medications will be in advance as this is different depending on your insurance coverage. However, we may be able to find a substitute medication at lower cost or fill out paperwork to get insurance to cover a needed medication.   If a prior authorization is required to get your medication covered by your insurance company, please allow us 1-2 business days to complete this process.  Drug prices often vary depending on where the prescription is filled and some pharmacies may offer cheaper prices.  The website www.goodrx.com contains coupons for medications through different pharmacies. The prices here do not account for what the cost may be with help from insurance (it may be cheaper with your insurance), but the website can give you the price if you did not use any insurance.  - You can print the associated coupon and take it with   your prescription to the pharmacy.  - You may also stop by our office during regular business hours and pick up a GoodRx coupon card.  - If you need your prescription sent electronically to a different pharmacy, notify our office through [facility] MyChart or by phone at [phone] option 4.     Si Usted Necesita Algo Despus de Su Visita  Tambin puede enviarnos un mensaje a travs de MyChart. Por lo general respondemos a los mensajes de MyChart en el transcurso de 1 a 2  das hbiles.  Para renovar recetas, por favor pida a su farmacia que se ponga en contacto con nuestra oficina. Nuestro nmero de fax es el [phone].  Si tiene un asunto urgente cuando la clnica est cerrada y que no puede esperar hasta el siguiente da hbil, puede llamar/localizar a su doctor(a) al nmero que aparece a continuacin.   Por favor, tenga en cuenta que aunque hacemos todo lo posible para estar disponibles para asuntos urgentes fuera del horario de oficina, no estamos disponibles las 24 horas del da, los 7 das de la semana.   Si tiene un problema urgente y no puede comunicarse con nosotros, puede optar por buscar atencin mdica  en el consultorio de su doctor(a), en una clnica privada, en un centro de atencin urgente o en una sala de emergencias.  Si tiene una emergencia mdica, por favor llame inmediatamente al 911 o vaya a la sala de emergencias.  Nmeros de bper  - Dr. Kowalski: [phone]  - Dra. Moye: [phone]  - Dra. Stewart: [phone]  En caso de inclemencias del tiempo, por favor llame a nuestra lnea principal al [phone] para una actualizacin sobre el estado de cualquier retraso o cierre.  Consejos para la medicacin en dermatologa: Por favor, guarde las cajas en las que vienen los medicamentos de uso tpico para ayudarle a seguir las instrucciones sobre dnde y cmo usarlos. Las farmacias generalmente imprimen las instrucciones del medicamento slo en las cajas y no directamente en los tubos del medicamento.   Si su medicamento es muy caro, por favor, pngase en contacto con nuestra oficina llamando al [phone] y presione la opcin 4 o envenos un mensaje a travs de MyChart.   No podemos decirle cul ser su copago por los medicamentos por adelantado ya que esto es diferente dependiendo de la cobertura de su seguro. Sin embargo, es posible que podamos encontrar un medicamento sustituto a menor costo o llenar un formulario para que el  seguro cubra el medicamento que se considera necesario.   Si se requiere una autorizacin previa para que su compaa de seguros cubra su medicamento, por favor permtanos de 1 a 2 das hbiles para completar este proceso.  Los precios de los medicamentos varan con frecuencia dependiendo del lugar de dnde se surte la receta y alguna farmacias pueden ofrecer precios ms baratos.  El sitio web www.goodrx.com tiene cupones para medicamentos de diferentes farmacias. Los precios aqu no tienen en cuenta lo que podra costar con la ayuda del seguro (puede ser ms barato con su seguro), pero el sitio web puede darle el precio si no utiliz ningn seguro.  - Puede imprimir el cupn correspondiente y llevarlo con su receta a la farmacia.  - Tambin puede pasar por nuestra oficina durante el horario de atencin regular y recoger una tarjeta de cupones de GoodRx.  - Si necesita que su receta se enve electrnicamente a una farmacia diferente, informe a nuestra oficina a travs de MyChart de    o por telfono llamando al [phone] y presione la opcin 4.  

## 2022-02-18 NOTE — Progress Notes (Signed)
   Follow-Up Visit   Subjective  Edward Castillo is a 46 y.o. male who presents for the following: Lentigos (Face, pt presents for BBL).  The following portions of the chart were reviewed this encounter and updated as appropriate:   Tobacco  Allergies  Meds  Problems  Med Hx  Surg Hx  Fam Hx     Review of Systems:  No other skin or systemic complaints except as noted in HPI or Assessment and Plan.  Objective  Well appearing patient in no apparent distress; mood and affect are within normal limits.  A focused examination was performed including face. Relevant physical exam findings are noted in the Assessment and Plan.  face Brown macules face                   Assessment & Plan  Lentigines face Discussed the treatment option of BBL/laser.  Typically we recommend 1-3 treatment sessions about 5-8 weeks apart for best results.  The patient's condition may require "maintenance treatments" in the future.  The fee for BBL / laser treatments is $350 per treatment session for the whole face.  A fee can be quoted for other parts of the body. Insurance typically does not pay for BBL/laser treatments and therefore the fee is an out-of-pocket cost.  Laser kit given to pt today  Photorejuvenation - face Prior to the procedure, the patient's past medical history, medications, allergies, and the rare but potential risks and complications were reviewed with the patient and a signed consent was obtained.  Pre and post treatment care was discussed and instructions provided.   [place] BBL - 02/18/22 1400      Patient Details   Skin Type: III    Anesthestic Cream Applied: No    Photo Takes: Yes    Consent Signed: Yes      Treatment Details   Date: 02/18/22    Treatment #: 1    Area: face    Filter: 1st Pass      1st Pass   Location: F    Device: filter 515    BBL j/cm2: 11    PW Msec Sec: 20    Cooling Temp: 22    Pulses: 264    15x15: this cyrstal    53m: this  crystal     Patient tolerated the procedure well.   SNancy Fetteravoidance was stressed. The patient will call with any problems, questions or concerns prior to their next appointment.  Return for 4-6 wks for BBL.  I, SOthelia Pulling RMA, am acting as scribe for DSarina Ser MD . Documentation: I have reviewed the above documentation for accuracy and completeness, and I agree with the above.  DSarina Ser MD

## 2022-03-02 ENCOUNTER — Other Ambulatory Visit: Payer: Self-pay | Admitting: Family Medicine

## 2022-03-02 DIAGNOSIS — J309 Allergic rhinitis, unspecified: Secondary | ICD-10-CM

## 2022-03-02 NOTE — Telephone Encounter (Signed)
Requested Prescriptions  Pending Prescriptions Disp Refills   flunisolide (NASALIDE) 25 MCG/ACT (0.025%) SOLN [Pharmacy Med Name: FLUNISOLIDE 0.025% SPRAY] 25 mL 0    Sig: PLACE 2 SPRAYS INTO THE NOSE 2 (TWO) TIMES DAILY.     Ear, Nose, and Throat: Nasal Preparations - Corticosteroids Passed - 03/02/2022  1:59 AM      Passed - Valid encounter within last 12 months    Recent Outpatient Visits           4 weeks ago Chronic allergic rhinitis   [facility] [place], [place]       Future Appointments             In 1 month Zigmund Daniel, Earley Abide, MD [facility] at [facility], [facility]

## 2022-03-09 ENCOUNTER — Encounter: Payer: Self-pay | Admitting: Dermatology

## 2022-03-11 ENCOUNTER — Telehealth: Payer: Self-pay

## 2022-03-11 NOTE — Telephone Encounter (Signed)
Patient is calling because he states he has never received his colonoscopy instructions and colonoscopy is schedule for tomorrow. Asked patient if he has been on clear liquids today. He states yes he has been on clear liquids sent his instructions to his mychart so he can read over

## 2022-03-11 NOTE — Telephone Encounter (Signed)
Called patient and said since he ate we would need to reschedule. Reschedule to 03/24/2022. Sent new instructions to mychart and informed Trish of the changed

## 2022-03-23 ENCOUNTER — Encounter: Payer: Self-pay | Admitting: Gastroenterology

## 2022-03-24 ENCOUNTER — Encounter
Admission: RE | Disposition: A | Discharge status: Home / Self Care | Payer: Self-pay | Source: Home / Self Care | Attending: Gastroenterology

## 2022-03-24 ENCOUNTER — Other Ambulatory Visit: Payer: Self-pay

## 2022-03-24 ENCOUNTER — Ambulatory Visit: Payer: No Typology Code available for payment source | Admitting: Anesthesiology

## 2022-03-24 ENCOUNTER — Encounter: Payer: Self-pay | Admitting: Gastroenterology

## 2022-03-24 ENCOUNTER — Ambulatory Visit
Admission: RE | Admit: 2022-03-24 | Discharge: 2022-03-24 | Disposition: A | Discharge status: Home / Self Care | Payer: No Typology Code available for payment source | Attending: Gastroenterology | Admitting: Gastroenterology

## 2022-03-24 DIAGNOSIS — F17200 Nicotine dependence, unspecified, uncomplicated: Secondary | ICD-10-CM | POA: Diagnosis not present

## 2022-03-24 DIAGNOSIS — Z1211 Encounter for screening for malignant neoplasm of colon: Secondary | ICD-10-CM

## 2022-03-24 DIAGNOSIS — F32A Depression, unspecified: Secondary | ICD-10-CM | POA: Insufficient documentation

## 2022-03-24 DIAGNOSIS — F419 Anxiety disorder, unspecified: Secondary | ICD-10-CM | POA: Diagnosis not present

## 2022-03-24 HISTORY — PX: COLONOSCOPY WITH PROPOFOL: SHX5780

## 2022-03-24 SURGERY — COLONOSCOPY WITH PROPOFOL
Anesthesia: General

## 2022-03-24 MED ORDER — SODIUM CHLORIDE 0.9 % IV SOLN: INTRAVENOUS | Status: DC

## 2022-03-24 MED ORDER — PROPOFOL 500 MG/50ML IV EMUL
INTRAVENOUS | Status: DC | PRN
  Administered 2022-03-24: 70 mg via INTRAVENOUS
  Administered 2022-03-24: 165 ug/kg/min via INTRAVENOUS

## 2022-03-24 MED ORDER — GLYCOPYRROLATE 0.2 MG/ML IJ SOLN
INTRAMUSCULAR | Status: AC
  Filled 2022-03-24: qty 2

## 2022-03-24 MED ORDER — STERILE WATER FOR IRRIGATION IR SOLN
Status: DC | PRN
  Administered 2022-03-24: 60 mL

## 2022-03-24 MED ORDER — GLYCOPYRROLATE 0.2 MG/ML IJ SOLN
INTRAMUSCULAR | Status: DC | PRN
  Administered 2022-03-24 (×2): .2 mg via INTRAVENOUS

## 2022-03-24 NOTE — Transfer of Care (Signed)
Immediate Anesthesia Transfer of Care Note  Patient: Edward Castillo  Procedure(s) Performed: COLONOSCOPY WITH PROPOFOL  Patient Location: PACU  Anesthesia Type:General  Level of Consciousness: awake and drowsy  Airway & Oxygen Therapy: Patient Spontanous Breathing  Post-op Assessment: Report given to RN and Post -op Vital signs reviewed and stable  Post vital signs: Reviewed and stable  Last Vitals:  Vitals Value Taken Time  BP 114/76 03/24/22 1101  Temp    Pulse 106 03/24/22 1101  Resp 15 03/24/22 1101  SpO2 97 % 03/24/22 1101  Vitals shown include unvalidated device data.  Last Pain:  Vitals:   03/24/22 0903  TempSrc: Temporal  PainSc: 0-No pain         Complications: No notable events documented.

## 2022-03-24 NOTE — H&P (Signed)
Edward Darby, MD [address]  [place]  [place], [place] [address]  Main: [phone]  Fax: [phone] Pager: [phone]  Primary Care Physician:  Edward Crouch, MD Primary Gastroenterologist:  Dr. Cephas Castillo  Pre-Procedure History & Physical: HPI:  Edward Castillo is a 46 y.o. male is here for an colonoscopy.   Past Medical History:  Diagnosis Date   Allergy    Inguinal hernia 1998    Past Surgical History:  Procedure Laterality Date   HERNIA REPAIR Left 04/05/1996   [facility]   WISDOM TOOTH EXTRACTION  1993    Prior to Admission medications   Medication Sig Start Date End Date Taking? Authorizing Provider  azelastine (ASTELIN) 0.1 % nasal spray Place 2 sprays into both nostrils as needed. 02/01/22  Yes Montel Culver, MD  flunisolide (NASALIDE) 25 MCG/ACT (0.025%) SOLN PLACE 2 SPRAYS INTO THE NOSE 2 (TWO) TIMES DAILY. 03/02/22  Yes Montel Culver, MD  tadalafil (CIALIS) 10 MG tablet Take 1 tablet (10 mg total) by mouth every other day as needed for erectile dysfunction. 02/01/22  Yes Montel Culver, MD    Allergies as of 02/02/2022 - Review Complete 02/01/2022  Allergen Reaction Noted   Sulfa antibiotics Other (See Comments) 10/03/2014    Family History  Problem Relation Age of Onset   Heart disease Father    Parkinson's disease Father     Social History   Socioeconomic History   Marital status: Married    Spouse name: Not on file   Number of children: Not on file   Years of education: Not on file   Highest education level: Not on file  Occupational History   Not on file  Tobacco Use   Smoking status: Every Day    Packs/day: 1.00    Years: 18.00    Total pack years: 18.00    Types: Cigarettes   Smokeless tobacco: Never  Vaping Use   Vaping Use: Never used  Substance and Sexual Activity   Alcohol use: No    Alcohol/week: 0.0 standard drinks of alcohol   Drug use: No   Sexual activity: Not on file  Other  Topics Concern   Not on file  Social History Narrative   Not on file   Social Determinants of Health   Financial Resource Strain: Not on file  Food Insecurity: No Food Insecurity (02/01/2022)   Hunger Vital Sign    Worried About Running Out of Food in the Last Year: Never true    Ran Out of Food in the Last Year: Never true  Transportation Needs: No Transportation Needs (02/01/2022)   PRAPARE - Hydrologist (Medical): No    Lack of Transportation (Non-Medical): No  Physical Activity: Not on file  Stress: Not on file  Social Connections: Not on file  Intimate Partner Violence: Not At Risk (02/01/2022)   Humiliation, Afraid, Rape, and Kick questionnaire    Fear of Current or Ex-Partner: No    Emotionally Abused: No    Physically Abused: No    Sexually Abused: No    Review of Systems: See HPI, otherwise negative ROS  Physical Exam: BP (!) 118/92   Pulse 75   Temp (!) 96.8 F (36 C) (Temporal)   Resp 18   Ht 6' (1.829 m)   Wt 87.1 kg   SpO2 98%   BMI 26.04 kg/m  General:   Alert,  pleasant and cooperative in NAD Head:  Normocephalic and atraumatic. Neck:  Supple; no masses or thyromegaly. Lungs:  Clear throughout to auscultation.    Heart:  Regular rate and rhythm. Abdomen:  Soft, nontender and nondistended. Normal bowel sounds, without guarding, and without rebound.   Neurologic:  Alert and  oriented x4;  grossly normal neurologically.  Impression/Plan: Edward Castillo is here for an colonoscopy to be performed for colon cancer screening  Risks, benefits, limitations, and alternatives regarding  colonoscopy have been reviewed with the patient.  Questions have been answered.  All parties agreeable.   Edward Sear, MD  03/24/2022, 9:18 AM

## 2022-03-24 NOTE — Op Note (Signed)
[facility] Gastroenterology Patient Name: Edward Castillo Procedure Date: 03/24/2022 10:29 AM MRN: [id number] Account #: [id number] Date of Birth: 02/25/1976 Admit Type: Outpatient Age: 46 Room: [facility] ENDO ROOM 4 Gender: Male Note Status: Finalized Instrument Name: Jasper Riling [id number] Procedure:             Colonoscopy Indications:           Screening for colorectal malignant neoplasm, This is                         the patient's first colonoscopy Providers:             Lin Landsman MD, MD Referring MD:          Leonie Douglas. Doy Hutching, MD (Referring MD) Medicines:             General Anesthesia Complications:         No immediate complications. Estimated blood loss: None. Procedure:             Pre-Anesthesia Assessment:                        - Prior to the procedure, a History and Physical was                         performed, and patient medications and allergies were                         reviewed. The patient is competent. The risks and                         benefits of the procedure and the sedation options and                         risks were discussed with the patient. All questions                         were answered and informed consent was obtained.                         Patient identification and proposed procedure were                         verified by the physician, the nurse, the                         anesthesiologist, the anesthetist and the technician                         in the pre-procedure area in the procedure room in the                         endoscopy suite. Mental Status Examination: alert and                         oriented. Airway Examination: normal oropharyngeal                         airway and neck mobility. Respiratory Examination:  clear to auscultation. CV Examination: normal.                         Prophylactic Antibiotics: The patient does not require                          prophylactic antibiotics. Prior Anticoagulants: The                         patient has taken no anticoagulant or antiplatelet                         agents. ASA Grade Assessment: II - A patient with mild                         systemic disease. After reviewing the risks and                         benefits, the patient was deemed in satisfactory                         condition to undergo the procedure. The anesthesia                         plan was to use general anesthesia. Immediately prior                         to administration of medications, the patient was                         re-assessed for adequacy to receive sedatives. The                         heart rate, respiratory rate, oxygen saturations,                         blood pressure, adequacy of pulmonary ventilation, and                         response to care were monitored throughout the                         procedure. The physical status of the patient was                         re-assessed after the procedure.                        After obtaining informed consent, the colonoscope was                         passed under direct vision. Throughout the procedure,                         the patient's blood pressure, pulse, and oxygen                         saturations were monitored continuously. The  Colonoscope was introduced through the anus and                         advanced to the the cecum, identified by appendiceal                         orifice and ileocecal valve. The colonoscopy was                         performed without difficulty. The patient tolerated                         the procedure well. The quality of the bowel                         preparation was evaluated using the BBPS [facility] Bowel                         Preparation Scale) with scores of: Right Colon = 3,                         Transverse Colon = 3 and Left Colon = 3 (entire mucosa                          seen well with no residual staining, small fragments                         of stool or opaque liquid). The total BBPS score                         equals 9. The ileocecal valve, appendiceal orifice,                         and rectum were photographed. Findings:      The perianal and digital rectal examinations were normal. Pertinent       negatives include normal sphincter tone and no palpable rectal lesions.      The entire examined colon appeared normal.      The retroflexed view of the distal rectum and anal verge was normal and       showed no anal or rectal abnormalities. Impression:            - The entire examined colon is normal.                        - The distal rectum and anal verge are normal on                         retroflexion view.                        - No specimens collected. Recommendation:        - Discharge patient to home (with escort).                        - Resume previous diet today.                        -  Continue present medications.                        - Repeat colonoscopy in 10 years for screening                         purposes. Procedure Code(s):     --- Professional ---                        Z6109, Colorectal cancer screening; colonoscopy on                         individual not meeting criteria for high risk Diagnosis Code(s):     --- Professional ---                        Z12.11, Encounter for screening for malignant neoplasm                         of colon CPT copyright 2022 American Medical Association. All rights reserved. The codes documented in this report are preliminary and upon coder review may  be revised to meet current compliance requirements. Dr. Ulyess Mort Lin Landsman MD, MD 03/24/2022 10:59:59 AM This report has been signed electronically. Number of Addenda: 0 Note Initiated On: 03/24/2022 10:29 AM Scope Withdrawal Time: 0 hours 9 minutes 23 seconds  Total Procedure Duration: 0 hours 13 minutes 51  seconds  Estimated Blood Loss:  Estimated blood loss: none.      [facility]

## 2022-03-24 NOTE — Anesthesia Preprocedure Evaluation (Signed)
Anesthesia Evaluation  Patient identified by MRN, date of birth, ID band Patient awake    Reviewed: Allergy & Precautions, H&P , NPO status , Patient's Chart, lab work & pertinent test results, reviewed documented beta blocker date and time   Airway Mallampati: II   Neck ROM: full    Dental  (+) Poor Dentition   Pulmonary neg pulmonary ROS, Current SmokerPatient did not abstain from smoking.   Pulmonary exam normal        Cardiovascular Exercise Tolerance: Good negative cardio ROS Normal cardiovascular exam Rhythm:regular Rate:Normal     Neuro/Psych  PSYCHIATRIC DISORDERS Anxiety Depression    negative neurological ROS     GI/Hepatic negative GI ROS, Neg liver ROS,,,  Endo/Other  negative endocrine ROS    Renal/GU negative Renal ROS  negative genitourinary   Musculoskeletal   Abdominal   Peds  Hematology negative hematology ROS (+)   Anesthesia Other Findings Past Medical History: No date: Allergy 1998: Inguinal hernia Past Surgical History: 04/05/1996: HERNIA REPAIR; Left     Comment:  [facility] 1993: WISDOM TOOTH EXTRACTION BMI    Body Mass Index: 26.04 kg/m     Reproductive/Obstetrics negative OB ROS                             Anesthesia Physical Anesthesia Plan  ASA: 2  Anesthesia Plan: General   Post-op Pain Management:    Induction:   PONV Risk Score and Plan:   Airway Management Planned:   Additional Equipment:   Intra-op Plan:   Post-operative Plan:   Informed Consent: I have reviewed the patients History and Physical, chart, labs and discussed the procedure including the risks, benefits and alternatives for the proposed anesthesia with the patient or authorized representative who has indicated his/her understanding and acceptance.     Dental Advisory Given  Plan Discussed with: CRNA  Anesthesia Plan Comments:        Anesthesia Quick  Evaluation

## 2022-03-25 ENCOUNTER — Encounter: Payer: Self-pay | Admitting: Gastroenterology

## 2022-03-25 NOTE — Anesthesia Postprocedure Evaluation (Signed)
Anesthesia Post Note  Patient: Edward Castillo  Procedure(s) Performed: COLONOSCOPY WITH PROPOFOL  Patient location during evaluation: PACU Anesthesia Type: General Level of consciousness: awake and alert Pain management: pain level controlled Vital Signs Assessment: post-procedure vital signs reviewed and stable Respiratory status: spontaneous breathing, nonlabored ventilation, respiratory function stable and patient connected to nasal cannula oxygen Cardiovascular status: blood pressure returned to baseline and stable Postop Assessment: no apparent nausea or vomiting Anesthetic complications: no   No notable events documented.   Last Vitals:  Vitals:   03/24/22 1110 03/24/22 1120  BP: 111/85 110/82  Pulse: 95 91  Resp: 13 17  Temp:    SpO2: 97% 100%    Last Pain:  Vitals:   03/24/22 1120  TempSrc:   PainSc: 0-No pain                 Molli Barrows

## 2022-03-25 NOTE — Anesthesia Postprocedure Evaluation (Signed)
Anesthesia Post Note  Patient: Edward Castillo  Procedure(s) Performed: COLONOSCOPY WITH PROPOFOL  Anesthesia Type: General Anesthetic complications: no   No notable events documented.   Last Vitals:  Vitals:   03/24/22 1110 03/24/22 1120  BP: 111/85 110/82  Pulse: 95 91  Resp: 13 17  Temp:    SpO2: 97% 100%    Last Pain:  Vitals:   03/24/22 1120  TempSrc:   PainSc: 0-No pain                 Molli Barrows

## 2022-04-06 ENCOUNTER — Encounter: Payer: No Typology Code available for payment source | Admitting: Family Medicine

## 2022-05-07 ENCOUNTER — Encounter: Payer: No Typology Code available for payment source | Admitting: Family Medicine

## 2022-05-18 ENCOUNTER — Ambulatory Visit: Payer: No Typology Code available for payment source | Admitting: Dermatology

## 2022-05-28 ENCOUNTER — Ambulatory Visit (INDEPENDENT_AMBULATORY_CARE_PROVIDER_SITE_OTHER): Payer: No Typology Code available for payment source | Admitting: Family Medicine

## 2022-05-28 ENCOUNTER — Encounter: Payer: Self-pay | Admitting: Family Medicine

## 2022-05-28 VITALS — BP 118/78 | HR 72 | Ht 72.0 in | Wt 199.0 lb

## 2022-05-28 DIAGNOSIS — Z114 Encounter for screening for human immunodeficiency virus [HIV]: Secondary | ICD-10-CM

## 2022-05-28 DIAGNOSIS — Z0000 Encounter for general adult medical examination without abnormal findings: Secondary | ICD-10-CM

## 2022-05-28 DIAGNOSIS — Z1159 Encounter for screening for other viral diseases: Secondary | ICD-10-CM

## 2022-05-28 DIAGNOSIS — Z720 Tobacco use: Secondary | ICD-10-CM

## 2022-05-28 DIAGNOSIS — Z13220 Encounter for screening for lipoid disorders: Secondary | ICD-10-CM

## 2022-05-28 DIAGNOSIS — N529 Male erectile dysfunction, unspecified: Secondary | ICD-10-CM | POA: Diagnosis not present

## 2022-05-28 DIAGNOSIS — J309 Allergic rhinitis, unspecified: Secondary | ICD-10-CM

## 2022-05-28 MED ORDER — TADALAFIL 10 MG PO TABS: 10.0000 mg | ORAL_TABLET | ORAL | 2 refills | Status: DC | PRN

## 2022-05-28 MED ORDER — FLUNISOLIDE 25 MCG/ACT (0.025%) NA SOLN: 2.0000 | Freq: Two times a day (BID) | NASAL | 2 refills | Status: DC

## 2022-05-28 NOTE — Patient Instructions (Signed)
-   Obtain fasting labs with orders provided (can have water or black coffee but otherwise no food or drink x 8 hours before labs) - Review information provided - Attend eye doctor annually, dentist every 6 months, work towards or maintain 30 minutes of moderate intensity physical activity at least 5 days per week, and consume a balanced diet - Return in 1 year for physical - Contact us for any questions between now and then 

## 2022-05-29 NOTE — Assessment & Plan Note (Signed)
Stable on current regimen   

## 2022-05-29 NOTE — Assessment & Plan Note (Signed)
Counseling x 5 minutes, treatment options reviewed, will contact us if ever ready to quit.

## 2022-05-29 NOTE — Progress Notes (Signed)
Annual Physical Exam Visit  Patient Information:  Patient ID: Edward Castillo, male DOB: Aug 21, 1975 Age: 47 y.o. MRN: [id number]   Subjective:   CC: Annual Physical Exam  HPI:  Edward Castillo is here for their annual physical.  I reviewed the past medical history, family history, social history, surgical history, and allergies today and changes were made as necessary.  Please see the problem list section below for additional details.  Past Medical History: Past Medical History:  Diagnosis Date   Allergy    Inguinal hernia 1998   Past Surgical History: Past Surgical History:  Procedure Laterality Date   COLONOSCOPY WITH PROPOFOL N/A 03/24/2022   Procedure: COLONOSCOPY WITH PROPOFOL;  Surgeon: Lin Landsman, MD;  Location: [facility] ENDOSCOPY;  Service: Gastroenterology;  Laterality: N/A;   HERNIA REPAIR Left 04/05/1996   [facility]   WISDOM TOOTH EXTRACTION  1993   Family History: Family History  Problem Relation Age of Onset   Heart disease Father    Parkinson's disease Father    Allergies: Allergies  Allergen Reactions   Sulfa Antibiotics Other (See Comments)    unknown   Health Maintenance: Health Maintenance  Topic Date Due   COVID-19 Vaccine (1) Never done   Hepatitis C Screening  Never done   DTaP/Tdap/Td (1 - Tdap) Never done   INFLUENZA VACCINE  07/04/2022 (Originally 11/03/2021)   COLONOSCOPY (Pts 45-64yr Insurance coverage will need to be confirmed)  03/24/2032   HIV Screening  Completed   HPV VACCINES  Aged Out    HM Colonoscopy          COLONOSCOPY (Pts 45-445yrInsurance coverage will need to be confirmed) (Every 10 Years) Next due on 03/24/2032    03/24/2022  COLONOSCOPY   Only the first 1 history entries have been loaded, but more history exists.           Medications: Current Outpatient Medications on File Prior to Visit  Medication Sig Dispense Refill   azelastine (ASTELIN) 0.1 % nasal spray Place 2 sprays into both  nostrils as needed. 30 mL 11   No current facility-administered medications on file prior to visit.    Review of Systems: No headache, visual changes, nausea, vomiting, diarrhea, constipation, dizziness, abdominal pain, skin rash, fevers, chills, night sweats, swollen lymph nodes, weight loss, chest pain, body aches, joint swelling, muscle aches, shortness of breath, mood changes, visual or auditory hallucinations reported.  Objective:   Vitals:   05/28/22 1507  BP: 118/78  Pulse: 72  SpO2: 98%   Vitals:   05/28/22 1507  Weight: 199 lb (90.3 kg)  Height: 6' (1.829 m)   Body mass index is 26.99 kg/m.  General: Well Developed, well nourished, and in no acute distress.  Neuro: Alert and oriented x3, extra-ocular muscles intact, sensation grossly intact. Cranial nerves II through XII are grossly intact, motor, sensory, and coordinative functions are intact. HEENT: Normocephalic, atraumatic, pupils equal round reactive to light, neck supple, no masses, no lymphadenopathy, thyroid nonpalpable. Oropharynx, nasopharynx, external ear canals are unremarkable. Skin: Warm and dry, no rashes noted.  Cardiac: Regular rate and rhythm, no murmurs rubs or gallops. No peripheral edema. Pulses symmetric. Respiratory: Clear to auscultation bilaterally. Not using accessory muscles, speaking in full sentences.  Abdominal: Soft, nontender, nondistended, positive bowel sounds, no masses, no organomegaly. Musculoskeletal: Shoulder, elbow, wrist, hip, knee, ankle stable, and with full range of motion.  Impression and Recommendations:   The patient was counselled, risk factors were discussed,  and anticipatory guidance given.  Problem List Items Addressed This Visit       Respiratory   Chronic allergic rhinitis    Did not see ENT, noting improvement with prior Rx (Astelin, flunisolide).      Relevant Medications   flunisolide (NASALIDE) 25 MCG/ACT (0.025%) SOLN     Other   Tobacco use     Counseling x 5 minutes, treatment options reviewed, will contact us if ever ready to quit.      Relevant Orders   CBC   Comprehensive metabolic panel   Erectile dysfunction    Stable on current regimen.      Relevant Medications   tadalafil (CIALIS) 10 MG tablet   Other Visit Diagnoses     Annual physical exam    -  Primary   Relevant Orders   CBC   Comprehensive metabolic panel   Hepatitis C antibody   HIV Antibody (routine testing w rflx)   Lipid panel   TSH   VITAMIN D 25 Hydroxy (Vit-D Deficiency, Fractures)   Screening for HIV (human immunodeficiency virus)       Relevant Orders   HIV Antibody (routine testing w rflx)   Need for hepatitis C screening test       Relevant Orders   Hepatitis C antibody   Screening for lipoid disorders       Relevant Orders   Comprehensive metabolic panel   Lipid panel        Orders & Medications Medications:  Meds ordered this encounter  Medications   flunisolide (NASALIDE) 25 MCG/ACT (0.025%) SOLN    Sig: Place 2 sprays into the nose 2 (two) times daily.    Dispense:  25 mL    Refill:  2   tadalafil (CIALIS) 10 MG tablet    Sig: Take 1 tablet (10 mg total) by mouth every other day as needed for erectile dysfunction.    Dispense:  10 tablet    Refill:  2   Orders Placed This Encounter  Procedures   CBC   Comprehensive metabolic panel   Hepatitis C antibody   HIV Antibody (routine testing w rflx)   Lipid panel   TSH   VITAMIN D 25 Hydroxy (Vit-D Deficiency, Fractures)     Return in about 1 year (around 05/29/2023) for CPE.    Montel Culver, MD, [facility]   Primary Care Sports Medicine Primary Care and Sports Medicine at [facility]

## 2022-05-29 NOTE — Assessment & Plan Note (Signed)
Did not see ENT, noting improvement with prior Rx (Astelin, flunisolide).

## 2022-06-01 ENCOUNTER — Ambulatory Visit: Payer: No Typology Code available for payment source | Admitting: Dermatology

## 2022-06-22 LAB — COMPREHENSIVE METABOLIC PANEL
ALT: 25 IU/L (ref 0–44)
AST: 21 IU/L (ref 0–40)
Albumin/Globulin Ratio: 2.2 (ref 1.2–2.2)
Albumin: 4.7 g/dL (ref 4.1–5.1)
Alkaline Phosphatase: 77 IU/L (ref 44–121)
BUN/Creatinine Ratio: 10 (ref 9–20)
BUN: 10 mg/dL (ref 6–24)
Bilirubin Total: 1 mg/dL (ref 0.0–1.2)
CO2: 25 mmol/L (ref 20–29)
Calcium: 9.7 mg/dL (ref 8.7–10.2)
Chloride: 104 mmol/L (ref 96–106)
Creatinine, Ser: 1.02 mg/dL (ref 0.76–1.27)
Globulin, Total: 2.1 g/dL (ref 1.5–4.5)
Glucose: 88 mg/dL (ref 70–99)
Potassium: 4.2 mmol/L (ref 3.5–5.2)
Sodium: 142 mmol/L (ref 134–144)
Total Protein: 6.8 g/dL (ref 6.0–8.5)
eGFR: 91 mL/min/{1.73_m2} (ref >59)

## 2022-06-22 LAB — CBC
Hematocrit: 46.8 % (ref 37.5–51.0)
Hemoglobin: 15.7 g/dL (ref 13.0–17.7)
MCH: 29.9 pg (ref 26.6–33.0)
MCHC: 33.5 g/dL (ref 31.5–35.7)
MCV: 89 fL (ref 79–97)
Platelets: 206 10*3/uL (ref 150–450)
RBC: 5.25 x10E6/uL (ref 4.14–5.80)
RDW: 11.9 % (ref 11.6–15.4)
WBC: 8.1 10*3/uL (ref 3.4–10.8)

## 2022-06-22 LAB — LIPID PANEL
Chol/HDL Ratio: 5.3 ratio — ABNORMAL HIGH (ref 0.0–5.0)
Cholesterol, Total: 187 mg/dL (ref 100–199)
HDL: 35 mg/dL — ABNORMAL LOW (ref >39)
LDL Chol Calc (NIH): 131 mg/dL — ABNORMAL HIGH (ref 0–99)
Triglycerides: 115 mg/dL (ref 0–149)
VLDL Cholesterol Cal: 21 mg/dL (ref 5–40)

## 2022-06-22 LAB — HIV ANTIBODY (ROUTINE TESTING W REFLEX): HIV Screen 4th Generation wRfx: NONREACTIVE

## 2022-06-22 LAB — HEPATITIS C ANTIBODY: Hep C Virus Ab: NONREACTIVE

## 2022-06-22 LAB — VITAMIN D 25 HYDROXY (VIT D DEFICIENCY, FRACTURES): Vit D, 25-Hydroxy: 15.7 ng/mL — ABNORMAL LOW (ref 30.0–100.0)

## 2022-06-22 LAB — TSH: TSH: 2.27 u[IU]/mL (ref 0.450–4.500)

## 2022-07-01 ENCOUNTER — Other Ambulatory Visit: Payer: Self-pay | Admitting: Family Medicine

## 2022-07-01 DIAGNOSIS — E782 Mixed hyperlipidemia: Secondary | ICD-10-CM

## 2022-07-01 MED ORDER — VITAMIN D (ERGOCALCIFEROL) 1.25 MG (50000 UNIT) PO CAPS: 50000.0000 [IU] | ORAL_CAPSULE | ORAL | 0 refills | Status: AC

## 2022-07-01 MED ORDER — ROSUVASTATIN CALCIUM 10 MG PO TABS: 10.0000 mg | ORAL_TABLET | Freq: Every day | ORAL | 3 refills | Status: DC

## 2022-07-01 NOTE — Progress Notes (Signed)
The 10-year ASCVD risk score (Arnett DK, et al., 2019) is: 7.4%   Values used to calculate the score:     Age: 47 years     Sex: Male     Is Non-Hispanic African American: No     Diabetic: No     Tobacco smoker: Yes     Systolic Blood Pressure: 123456 mmHg     Is BP treated: No     HDL Cholesterol: 35 mg/dL     Total Cholesterol: 187 mg/dL  Statin advised to patient, Rx for Crestor 10 mg sent to pharmacy.

## 2023-04-12 ENCOUNTER — Other Ambulatory Visit: Payer: Self-pay | Admitting: Family Medicine

## 2023-04-12 DIAGNOSIS — N529 Male erectile dysfunction, unspecified: Secondary | ICD-10-CM

## 2023-04-14 NOTE — Telephone Encounter (Signed)
 Requested medications are due for refill today.  yes  Requested medications are on the active medications list.  yes  Last refill. 05/28/2022 #10 2 rf  Future visit scheduled.   yes  Notes to clinic.  Edward Castillo listed as PCP.    Requested Prescriptions  Pending Prescriptions Disp Refills   tadalafil  (CIALIS ) 10 MG tablet [Pharmacy Med Name: TADALAFIL  10 MG TABLET] 10 tablet 2    Sig: Take 1 tablet (10 mg total) by mouth every other day as needed for erectile dysfunction.     Urology: Erectile Dysfunction Agents Passed - 04/14/2023 12:59 PM      Passed - AST in normal range and within 360 days    AST  Date Value Ref Range Status  06/21/2022 21 0 - 40 IU/L Final         Passed - ALT in normal range and within 360 days    ALT  Date Value Ref Range Status  06/21/2022 25 0 - 44 IU/L Final         Passed - Last BP in normal range    BP Readings from Last 1 Encounters:  05/28/22 118/78         Passed - Valid encounter within last 12 months    Recent Outpatient Visits           10 months ago Annual physical exam   [place] [facility], Selinda PARAS, MD   1 year ago Chronic allergic rhinitis   [place] [facility] at [facility], Selinda PARAS, MD       Future Appointments             In 2 months Ku, Selinda PARAS, MD [facility] Medicine at [facility], [facility]

## 2023-06-03 ENCOUNTER — Encounter: Payer: No Typology Code available for payment source | Admitting: Family Medicine

## 2023-06-21 ENCOUNTER — Ambulatory Visit (INDEPENDENT_AMBULATORY_CARE_PROVIDER_SITE_OTHER): Payer: Self-pay | Admitting: Family Medicine

## 2023-06-21 VITALS — BP 138/74 | HR 72 | Ht 72.0 in | Wt 194.6 lb

## 2023-06-21 DIAGNOSIS — J309 Allergic rhinitis, unspecified: Secondary | ICD-10-CM

## 2023-06-21 DIAGNOSIS — E785 Hyperlipidemia, unspecified: Secondary | ICD-10-CM | POA: Insufficient documentation

## 2023-06-21 DIAGNOSIS — R7309 Other abnormal glucose: Secondary | ICD-10-CM

## 2023-06-21 DIAGNOSIS — N529 Male erectile dysfunction, unspecified: Secondary | ICD-10-CM | POA: Diagnosis not present

## 2023-06-21 DIAGNOSIS — N400 Enlarged prostate without lower urinary tract symptoms: Secondary | ICD-10-CM

## 2023-06-21 DIAGNOSIS — F32A Depression, unspecified: Secondary | ICD-10-CM

## 2023-06-21 DIAGNOSIS — E782 Mixed hyperlipidemia: Secondary | ICD-10-CM | POA: Diagnosis not present

## 2023-06-21 DIAGNOSIS — Z720 Tobacco use: Secondary | ICD-10-CM

## 2023-06-21 DIAGNOSIS — Z23 Encounter for immunization: Secondary | ICD-10-CM | POA: Insufficient documentation

## 2023-06-21 DIAGNOSIS — F419 Anxiety disorder, unspecified: Secondary | ICD-10-CM

## 2023-06-21 DIAGNOSIS — Z0000 Encounter for general adult medical examination without abnormal findings: Secondary | ICD-10-CM

## 2023-06-21 MED ORDER — TADALAFIL 10 MG PO TABS: 10.0000 mg | ORAL_TABLET | ORAL | 11 refills | Status: AC | PRN

## 2023-06-21 MED ORDER — ROSUVASTATIN CALCIUM 10 MG PO TABS: 10.0000 mg | ORAL_TABLET | Freq: Every day | ORAL | 3 refills | Status: AC

## 2023-06-21 MED ORDER — FLUNISOLIDE 25 MCG/ACT (0.025%) NA SOLN: 2.0000 | Freq: Two times a day (BID) | NASAL | 2 refills | Status: AC

## 2023-06-21 MED ORDER — AZELASTINE HCL 0.1 % NA SOLN: 2.0000 | NASAL | 11 refills | Status: DC | PRN

## 2023-06-21 NOTE — Patient Instructions (Addendum)
-   Obtain fasting labs with orders provided (can have water or black coffee but otherwise no food or drink x 8 hours before labs) - Review information provided - Attend eye doctor annually, dentist every 6 months, work towards or maintain 30 minutes of moderate intensity physical activity at least 5 days per week, and consume a balanced diet - Return in 1 year for physical - Contact us for any questions between now and then   Patient Action Plan:  1. Allergic Rhinitis Management:    - Refill and consistently use flunisolide and Astelin nasal sprays during allergy season.    - Consider using oral antihistamines like Claritin, Zyrtec, or Allegra for persistent symptoms.    - Schedule and attend the ENT appointment for further evaluation and treatment options.  2. Erectile Dysfunction:    - Refill and continue taking Cialis as needed.  3. Hyperlipidemia Management:    - Start taking the prescribed statin in the evening to help lower cholesterol.    - Schedule a follow-up lipid panel test in two to three months to monitor progress.  4. Tetanus Vaccination:    - Schedule a nurse visit for the Tdap vaccination next week.  5. Tobacco Use:    - If considering quitting smoking, discuss the use of bupropion with your healthcare provider to help reduce cravings.  Red Flags to Watch For: - If you experience any unusual symptoms or side effects from medications, contact your healthcare provider immediately.  Please follow these steps and reach out to your healthcare providers for any questions or concerns.

## 2023-06-21 NOTE — Assessment & Plan Note (Signed)
 PHQ and GAD scores reassuring, no current issues reported.

## 2023-06-21 NOTE — Assessment & Plan Note (Signed)
 He has been reducing his smoking from a pack and a half to about eighteen cigarettes a day. He is considering further reduction but does not express a desire to quit completely at this time.  Tobacco Use Disorder Current smoker, reduced to eighteen cigarettes daily. Discussed quitting challenges and bupropion use. Explained bupropion aids in reducing cravings. - Discuss bupropion for smoking cessation if he decides to quit.

## 2023-06-21 NOTE — Assessment & Plan Note (Signed)
 He experiences worsening allergies, particularly with increased pollen exposure. He uses flunisolide and Astelin nasal sprays as needed. He has used these medications on an as-needed basis since the last visit but has not refilled his prescriptions recently.  Allergic Rhinitis Allergic rhinitis exacerbated by pollen. Current treatment includes flunisolide and Astelin use as needed. Discussed medication roles and emphasized consistent use during allergy season. Advised on oral antihistamines for extended symptoms. - Refill flunisolide and Astelin nasal sprays. - Advise consistent use of nasal sprays during allergy season. - Consider oral antihistamines for extended symptoms (Claritin, Zyrtec, Allegra, etc.). - New referral placed to ENT for further evaluation and treatment options.

## 2023-06-21 NOTE — Assessment & Plan Note (Signed)
 Hyperlipidemia Hyperlipidemia with total cholesterol to HDL ratio of 5.3. ASCVD risk score 10.2%. Discussed statin therapy benefits and lifestyle changes. Explained statins reduce cardiovascular risk with minimal side effects, importance of evening administration. - Prescribe statin for evening dosing. - Order follow-up lipid panel in two to three months.

## 2023-06-21 NOTE — Assessment & Plan Note (Signed)
 Tetanus Vaccination Overdue for tetanus vaccination. Concerns about arm soreness affecting yard work. Discussed importance and minimal side effects. Agreed to schedule nurse visit for vaccination. - Schedule nurse visit for Tdap vaccination next week.

## 2023-06-21 NOTE — Progress Notes (Signed)
 Annual Physical Exam Visit  Patient Information:  Patient ID: Edward Castillo, male DOB: Jul 21, 1975 Age: 48 y.o. MRN: [id number]   Subjective:   CC: Annual Physical Exam  HPI:  Edward Castillo is here for their annual physical.  I reviewed the past medical history, family history, social history, surgical history, and allergies today and changes were made as necessary.  Please see the problem list section below for additional details.  Past Medical History: Past Medical History:  Diagnosis Date   Allergy    Inguinal hernia 1998   Past Surgical History: Past Surgical History:  Procedure Laterality Date   COLONOSCOPY WITH PROPOFOL N/A 03/24/2022   Procedure: COLONOSCOPY WITH PROPOFOL;  Surgeon: Toney Reil, MD;  Location: [facility] ENDOSCOPY;  Service: Gastroenterology;  Laterality: N/A;   HERNIA REPAIR Left 04/05/1996   [facility]   WISDOM TOOTH EXTRACTION  1993   Family History: Family History  Problem Relation Age of Onset   Heart disease Father    Parkinson's disease Father    Allergies: Allergies  Allergen Reactions   Sulfa Antibiotics Other (See Comments)    unknown   Health Maintenance: Health Maintenance  Topic Date Due   INFLUENZA VACCINE  07/04/2023 (Originally 11/04/2022)   COVID-19 Vaccine (1) 07/07/2023 (Originally 01/29/1980)   DTaP/Tdap/Td (1 - Tdap) 06/20/2024 (Originally 01/28/1994)   Pneumococcal Vaccine 37-55 Years old (1 of 2 - PCV) 06/20/2024 (Originally 01/28/1981)   Colonoscopy  03/24/2032   Hepatitis C Screening  Completed   HIV Screening  Completed   HPV VACCINES  Aged Out    HM Colonoscopy          Upcoming     Colonoscopy (Every 10 Years) Next due on 03/24/2032    03/24/2022  COLONOSCOPY   Only the first 1 history entries have been loaded, but more history exists.               Medications: Current Outpatient Medications on File Prior to Visit  Medication Sig Dispense Refill   Vitamin D, Ergocalciferol,  (DRISDOL) 1.25 MG (50000 UNIT) CAPS capsule Take 1 capsule (50,000 Units total) by mouth every 7 (seven) days. Take for 8 total doses(weeks) 8 capsule 0   No current facility-administered medications on file prior to visit.    Objective:   Vitals:   06/21/23 0848  BP: 138/74  Pulse: 72  SpO2: 97%   Vitals:   06/21/23 0848  Weight: 194 lb 9.6 oz (88.3 kg)  Height: 6' (1.829 m)   Body mass index is 26.39 kg/m.  General: Well Developed, well nourished, and in no acute distress.  Neuro: Alert and oriented x3, extra-ocular muscles intact, sensation grossly intact. Cranial nerves II through XII are grossly intact, motor, sensory, and coordinative functions are intact. HEENT: Normocephalic, atraumatic, neck supple, no masses, no lymphadenopathy, thyroid nonenlarged. Oropharynx, nasopharynx with mildly erythematous turbinates, external ear canals are unremarkable. Skin: Warm and dry, no rashes noted.  Cardiac: Regular rate and rhythm, no murmurs rubs or gallops. No peripheral edema. Pulses symmetric. Respiratory: Clear to auscultation bilaterally. Speaking in full sentences.  Abdominal: Soft, nontender, nondistended, positive bowel sounds, no masses, no organomegaly. Musculoskeletal: Stable, and with full range of motion.   Impression and Recommendations:   The patient was counselled, risk factors were discussed, and anticipatory guidance given.  Problem List Items Addressed This Visit     Anxiety and depression   PHQ and GAD scores reassuring, no current issues reported.  Chronic allergic rhinitis   He experiences worsening allergies, particularly with increased pollen exposure. He uses flunisolide and Astelin nasal sprays as needed. He has used these medications on an as-needed basis since the last visit but has not refilled his prescriptions recently.  Allergic Rhinitis Allergic rhinitis exacerbated by pollen. Current treatment includes flunisolide and Astelin use as  needed. Discussed medication roles and emphasized consistent use during allergy season. Advised on oral antihistamines for extended symptoms. - Refill flunisolide and Astelin nasal sprays. - Advise consistent use of nasal sprays during allergy season. - Consider oral antihistamines for extended symptoms (Claritin, Zyrtec, Allegra, etc.). - New referral placed to ENT for further evaluation and treatment options.      Relevant Medications   flunisolide (NASALIDE) 25 MCG/ACT (0.025%) SOLN   azelastine (ASTELIN) 0.1 % nasal spray   Other Relevant Orders   Ambulatory referral to ENT   Erectile dysfunction   He mentions running out of Cialis, which he uses for erectile dysfunction, and requests a refill. The medication has been effective for him.        Relevant Medications   tadalafil (CIALIS) 10 MG tablet   Hyperlipidemia   Hyperlipidemia Hyperlipidemia with total cholesterol to HDL ratio of 5.3. ASCVD risk score 10.2%. Discussed statin therapy benefits and lifestyle changes. Explained statins reduce cardiovascular risk with minimal side effects, importance of evening administration. - Prescribe statin for evening dosing. - Order follow-up lipid panel in two to three months.      Relevant Medications   tadalafil (CIALIS) 10 MG tablet   rosuvastatin (CRESTOR) 10 MG tablet   Other Relevant Orders   Comprehensive metabolic panel   Lipid panel   Need for diphtheria-tetanus-pertussis (Tdap) vaccine   Tetanus Vaccination Overdue for tetanus vaccination. Concerns about arm soreness affecting yard work. Discussed importance and minimal side effects. Agreed to schedule nurse visit for vaccination. - Schedule nurse visit for Tdap vaccination next week.      Tobacco use   He has been reducing his smoking from a pack and a half to about eighteen cigarettes a day. He is considering further reduction but does not express a desire to quit completely at this time.  Tobacco Use Disorder Current  smoker, reduced to eighteen cigarettes daily. Discussed quitting challenges and bupropion use. Explained bupropion aids in reducing cravings. - Discuss bupropion for smoking cessation if he decides to quit.      Other Visit Diagnoses       Healthcare maintenance    -  Primary   Relevant Orders   CBC     Benign prostatic hyperplasia, unspecified whether lower urinary tract symptoms present       Relevant Orders   PSA Total (Reflex To Free)     Abnormal glucose       Relevant Orders   Hemoglobin A1c        Orders & Medications Medications:  Meds ordered this encounter  Medications   flunisolide (NASALIDE) 25 MCG/ACT (0.025%) SOLN    Sig: Place 2 sprays into the nose 2 (two) times daily.    Dispense:  25 mL    Refill:  2   azelastine (ASTELIN) 0.1 % nasal spray    Sig: Place 2 sprays into both nostrils as needed.    Dispense:  30 mL    Refill:  11   tadalafil (CIALIS) 10 MG tablet    Sig: Take 1 tablet (10 mg total) by mouth every other day as needed for erectile dysfunction.  Dispense:  10 tablet    Refill:  11   rosuvastatin (CRESTOR) 10 MG tablet    Sig: Take 1 tablet (10 mg total) by mouth daily.    Dispense:  90 tablet    Refill:  3   Orders Placed This Encounter  Procedures   CBC   Comprehensive metabolic panel   Hemoglobin A1c   Lipid panel   PSA Total (Reflex To Free)   Ambulatory referral to ENT     Return for Patient preference next week for NURSE VISIT for tdap, also schedule CPE for 1 year.    Jerrol Banana, MD, [facility]   Primary Care Sports Medicine Primary Care and Sports Medicine at [facility]

## 2023-06-21 NOTE — Assessment & Plan Note (Signed)
 He mentions running out of Cialis, which he uses for erectile dysfunction, and requests a refill. The medication has been effective for him.

## 2023-06-22 ENCOUNTER — Encounter: Payer: Self-pay | Admitting: Family Medicine

## 2023-06-22 ENCOUNTER — Other Ambulatory Visit: Payer: Self-pay | Admitting: Family Medicine

## 2023-06-22 DIAGNOSIS — E782 Mixed hyperlipidemia: Secondary | ICD-10-CM

## 2023-06-22 LAB — COMPREHENSIVE METABOLIC PANEL
ALT: 23 IU/L (ref 0–44)
AST: 23 IU/L (ref 0–40)
Albumin: 4.8 g/dL (ref 4.1–5.1)
Alkaline Phosphatase: 85 IU/L (ref 44–121)
BUN/Creatinine Ratio: 10 (ref 9–20)
BUN: 11 mg/dL (ref 6–24)
Bilirubin Total: 1 mg/dL (ref 0.0–1.2)
CO2: 25 mmol/L (ref 20–29)
Calcium: 9.9 mg/dL (ref 8.7–10.2)
Chloride: 105 mmol/L (ref 96–106)
Creatinine, Ser: 1.1 mg/dL (ref 0.76–1.27)
Globulin, Total: 1.9 g/dL (ref 1.5–4.5)
Glucose: 83 mg/dL (ref 70–99)
Potassium: 5.1 mmol/L (ref 3.5–5.2)
Sodium: 143 mmol/L (ref 134–144)
Total Protein: 6.7 g/dL (ref 6.0–8.5)
eGFR: 83 mL/min/{1.73_m2} (ref >59)

## 2023-06-22 LAB — PSA TOTAL (REFLEX TO FREE): Prostate Specific Ag, Serum: 0.7 ng/mL (ref 0.0–4.0)

## 2023-06-22 LAB — CBC
Hematocrit: 46.8 % (ref 37.5–51.0)
Hemoglobin: 15.6 g/dL (ref 13.0–17.7)
MCH: 29.8 pg (ref 26.6–33.0)
MCHC: 33.3 g/dL (ref 31.5–35.7)
MCV: 89 fL (ref 79–97)
Platelets: 231 10*3/uL (ref 150–450)
RBC: 5.24 x10E6/uL (ref 4.14–5.80)
RDW: 11.7 % (ref 11.6–15.4)
WBC: 6.8 10*3/uL (ref 3.4–10.8)

## 2023-06-22 LAB — LIPID PANEL
Chol/HDL Ratio: 5.3 ratio — ABNORMAL HIGH (ref 0.0–5.0)
Cholesterol, Total: 186 mg/dL (ref 100–199)
HDL: 35 mg/dL — ABNORMAL LOW (ref >39)
LDL Chol Calc (NIH): 134 mg/dL — ABNORMAL HIGH (ref 0–99)
Triglycerides: 91 mg/dL (ref 0–149)
VLDL Cholesterol Cal: 17 mg/dL (ref 5–40)

## 2023-06-22 LAB — HEMOGLOBIN A1C
Est. average glucose Bld gHb Est-mCnc: 105 mg/dL
Hgb A1c MFr Bld: 5.3 % (ref 4.8–5.6)

## 2023-06-27 ENCOUNTER — Ambulatory Visit (INDEPENDENT_AMBULATORY_CARE_PROVIDER_SITE_OTHER)

## 2023-06-27 DIAGNOSIS — Z23 Encounter for immunization: Secondary | ICD-10-CM | POA: Diagnosis not present

## 2023-09-01 ENCOUNTER — Ambulatory Visit (INDEPENDENT_AMBULATORY_CARE_PROVIDER_SITE_OTHER): Admitting: Otolaryngology

## 2023-09-01 ENCOUNTER — Encounter (INDEPENDENT_AMBULATORY_CARE_PROVIDER_SITE_OTHER): Payer: Self-pay | Admitting: Otolaryngology

## 2023-09-01 VITALS — BP 123/80 | HR 83 | Ht 72.0 in | Wt 192.0 lb

## 2023-09-01 DIAGNOSIS — R0982 Postnasal drip: Secondary | ICD-10-CM | POA: Diagnosis not present

## 2023-09-01 DIAGNOSIS — J342 Deviated nasal septum: Secondary | ICD-10-CM | POA: Diagnosis not present

## 2023-09-01 DIAGNOSIS — J343 Hypertrophy of nasal turbinates: Secondary | ICD-10-CM | POA: Diagnosis not present

## 2023-09-01 DIAGNOSIS — R0981 Nasal congestion: Secondary | ICD-10-CM | POA: Diagnosis not present

## 2023-09-01 DIAGNOSIS — F17200 Nicotine dependence, unspecified, uncomplicated: Secondary | ICD-10-CM

## 2023-09-01 DIAGNOSIS — F17210 Nicotine dependence, cigarettes, uncomplicated: Secondary | ICD-10-CM

## 2023-09-01 MED ORDER — AZELASTINE HCL 0.1 % NA SOLN: 2.0000 | Freq: Every day | NASAL | 12 refills | Status: AC

## 2023-09-01 NOTE — Progress Notes (Signed)
 Dear Dr. Augustus Ledger, Here is my assessment for our mutual patient, Yoav Okane. Thank you for allowing me the opportunity to care for your patient. Please do not hesitate to contact me should you have any other questions. Sincerely, Dr. Milon Aloe  Otolaryngology Clinic Note  HISTORY: Edward Castillo is a 48 y.o. male kindly referred by Dr. Augustus Ledger for evaluation of nasal congestion and allergic rhinitis.   Initial visit (08/2023): He reports post nasal drip and some congestion (feels like mucus is build up and has to cough it up). Worse with cold and when nose is dry. Worse after COVID. No obstruction. Does have seasonal allergies, mostly in fall when symptoms are worse -- itching of nose, watery eyes, congestion. No facial pain/pressure, hyposmia, and no discolored drainage from nose. No frequent sinus infections (for past several years). He is currently on flunisolide , and astelin  -- takes as needed -- not much improvement. Humidifier helps.  Allergy testing has not been done. No previous sinonasal surgery.  GLP-1: no AP/AC: no  Tobacco: smokes ~1 PPD - 23 years  PMHx: Anxiety/Depression, ED, HLD  Occupation: works in tobacco industry  RADIOGRAPHIC EVALUATION AND INDEPENDENT REVIEW OF OTHER RECORDS: Dr. Augustus Ledger (06/21/2023): noted worsening allergies with increased pollen exposure; on flunisolide  and astelin  PRN; PO antihistamine; Dx; AR; Rx: Ref to ENT Labs reviewed CBC and CMP (06/21/2023):WBC 6.8, BUN/Cr 11/1.1; Eos (2019) 4 CT Sinus 08/2004: can't review images  Past Medical History:  Diagnosis Date   Allergy    Inguinal hernia 1998   Past Surgical History:  Procedure Laterality Date   COLONOSCOPY WITH PROPOFOL  N/A 03/24/2022   Procedure: COLONOSCOPY WITH PROPOFOL ;  Surgeon: Selena Daily, MD;  Location: [facility] ENDOSCOPY;  Service: Gastroenterology;  Laterality: N/A;   HERNIA REPAIR Left 04/05/1996   [facility]   WISDOM TOOTH EXTRACTION  1993   Family  History  Problem Relation Age of Onset   Heart disease Father    Parkinson's disease Father    Social History   Tobacco Use   Smoking status: Every Day    Current packs/day: 1.00    Average packs/day: 1 pack/day for 18.0 years (18.0 ttl pk-yrs)    Types: Cigarettes   Smokeless tobacco: Never  Substance Use Topics   Alcohol use: No    Alcohol/week: 0.0 standard drinks of alcohol   Allergies  Allergen Reactions   Sulfa Antibiotics Other (See Comments)    unknown   Current Outpatient Medications  Medication Sig Dispense Refill   azelastine  (ASTELIN ) 0.1 % nasal spray Place 2 sprays into both nostrils at bedtime. Use in each nostril as directed 30 mL 12   flunisolide  (NASALIDE ) 25 MCG/ACT (0.025%) SOLN Place 2 sprays into the nose 2 (two) times daily. 25 mL 2   rosuvastatin  (CRESTOR ) 10 MG tablet Take 1 tablet (10 mg total) by mouth daily. 90 tablet 3   tadalafil  (CIALIS ) 10 MG tablet Take 1 tablet (10 mg total) by mouth every other day as needed for erectile dysfunction. 10 tablet 11   Vitamin D , Ergocalciferol , (DRISDOL ) 1.25 MG (50000 UNIT) CAPS capsule Take 1 capsule (50,000 Units total) by mouth every 7 (seven) days. Take for 8 total doses(weeks) 8 capsule 0   No current facility-administered medications for this visit.   BP 123/80 (BP Location: Left Arm, Patient Position: Sitting, Cuff Size: Normal)   Pulse 83   Ht 6' (1.829 m)   Wt 192 lb (87.1 kg)   SpO2 96%   BMI 26.04 kg/m  PHYSICAL EXAM:  BP 123/80 (BP Location: Left Arm, Patient Position: Sitting, Cuff Size: Normal)   Pulse 83   Ht 6' (1.829 m)   Wt 192 lb (87.1 kg)   SpO2 96%   BMI 26.04 kg/m    Salient findings:  CN II-XII intact Bilateral EAC clear and TM intact with well pneumatized middle ear spaces Nose: Anterior rhinoscopy reveals right septal deviation, bilateral inferior turbinate hypertrophy.  Nasal endoscopy was indicated to better evaluate the nose and paranasal sinuses, given the patient's  history and exam findings, and is detailed below. No lesions of oral cavity/oropharynx; dentition fair No obviously palpable neck masses/lymphadenopathy/thyromegaly No respiratory distress or stridor   PROCEDURE:  Prior to initiating any procedures, risks/benefits/alternatives were explained to the patient and verbal consent obtained. Diagnostic Nasal Endoscopy Pre-procedure diagnosis: Nasal obstruction, nasal congestion, rule out structural cause Post-procedure diagnosis: same Indication: See pre-procedure diagnosis and physical exam above Complications: None apparent EBL: 0 mL Anesthesia: Lidocaine 4% and topical decongestant was topically sprayed in each nasal cavity  Description of Procedure:  Patient was identified. A rigid 30 degree endoscope was utilized to evaluate the sinonasal cavities, mucosa, sinus ostia and turbinates and septum.  Overall, signs of mucosal inflammation are not noted.  Also noted are right septal deviation, bilateral ITH.  No mucopurulence, polyps, or masses noted.  Mild dryness Right Middle meatus: clear Right SE Recess: clear Left MM: clear Left SE Recess: clear    Photodocumentation was obtained.  CPT CODE -- 31231 - Mod 25   ASSESSMENT:  48 y.o. with:  1. Nasal congestion   2. Hypertrophy of both inferior nasal turbinates   3. Nasal septal deviation   4. Post-nasal drip   5. Tobacco use disorder    Primary complaint congestion and PND. Does note some seasonality and nose is mildly dry. We did discuss that congestion may be caused by his septal deviation and certainly turbinates may be playing a role.   Given the patient's tobacco use, I also discussed cessation and options for cessation, including counseling. Counseled patient on the dangers of tobacco use, advised patient to stop smoking, and reviewed strategies to maximize success. Patient is not not ready to quit, and declined further treatment. Total time spent with this was 5 minutes.    PLAN: We've discussed issues and options today.  We reviewed the nasal endoscopy images together.  The risks, benefits and alternatives were discussed and questions answered.  He has elected to proceed with medical management:  1) Continue PO steroid BID; add astelin  BID 2) Consider daily sinus rinses 3) For nasal dryness, use ayr gel multiple times per day D/w pt f/u, opted PRN; if sx do not improve, can consider septo/turbs. He understood  See below regarding exact medications prescribed this encounter including dosages and route: Meds ordered this encounter  Medications   azelastine  (ASTELIN ) 0.1 % nasal spray    Sig: Place 2 sprays into both nostrils at bedtime. Use in each nostril as directed    Dispense:  30 mL    Refill:  12     Thank you for allowing me the opportunity to care for your patient. Please do not hesitate to contact me should you have any other questions.  Sincerely, Milon Aloe, MD Otolaryngologist (ENT), [facility] Phone: [phone] Fax: [phone]  MDM:  Level 4: [phone] Complexity/Problems addressed: low Data complexity: mod - independent review of notes, labs, review of test - Morbidity: mod  - Drug prescribed or managed:  y  09/11/2023, 4:53 PM

## 2023-09-01 NOTE — Patient Instructions (Addendum)
 Use the steroid spray and the astelin  two sprays each nostril for each spray nightly AYR gel -- buy it over the counter Ayr gel: Apply a pea sized amount up to 4 times daily just to inside of each nostril like above, then pinch your nose for 10 seconds. Use this consistently.    You can use baby oil or ponaris oil in the nose 2-3 times per day (especially at night)    Andres Bangs Med Nasal Saline Rinse  - start nasal saline rinses with NeilMed Bottle available over the counter    Nasal Saline Irrigation instructions: If you choose to make your own salt water  solution, You will need: Salt (kosher, canning, or pickling salt) Baking soda Nasal irrigation bottle (i.e. Andres Bangs Med Sinus Rinse) Measuring spoon ( teaspoon) Distilled / boiled water    Mix solution Mix 1 teaspoon of salt, 1/2 teaspoon of baking soda and 1 cup of water  into irrigation bottle ** May use saline packet instead of homemade recipe for this step if you prefer If medicine was prescribed to be mixed with solution, place this into bottle Examples 2 inches of 2% mupirocin ointment Budesonide solution Position your head: Lean over sink (about 45 degrees) Rotate head (about 45 degrees) so that one nostril is above the other Irrigate Insert tip of irrigation bottle into upper nostril so it forms a comfortable seal Irrigate while breathing through your mouth May remove the straw from the bottle in order to irrigate the entire solution (important if medicine was added) Exhale through nose when finished and blow nose as necessary  Repeat on opposite side with other 1/2 of solution (120 mL) or remake solution if all 240 mL was used on first side Wash irrigation bottle regularly, replace every 3 months

## 2024-03-15 ENCOUNTER — Encounter: Payer: Self-pay | Admitting: Family Medicine

## 2024-03-29 ENCOUNTER — Emergency Department

## 2024-03-29 ENCOUNTER — Other Ambulatory Visit: Payer: Self-pay

## 2024-03-29 ENCOUNTER — Emergency Department
Admission: EM | Admit: 2024-03-29 | Discharge: 2024-03-29 | Disposition: A | Discharge status: Home / Self Care | Attending: Emergency Medicine | Admitting: Emergency Medicine

## 2024-03-29 DIAGNOSIS — J101 Influenza due to other identified influenza virus with other respiratory manifestations: Secondary | ICD-10-CM | POA: Diagnosis not present

## 2024-03-29 DIAGNOSIS — R55 Syncope and collapse: Secondary | ICD-10-CM | POA: Insufficient documentation

## 2024-03-29 DIAGNOSIS — E860 Dehydration: Secondary | ICD-10-CM | POA: Diagnosis not present

## 2024-03-29 DIAGNOSIS — I951 Orthostatic hypotension: Secondary | ICD-10-CM

## 2024-03-29 LAB — CBC
HCT: 44.5 % (ref 39.0–52.0)
Hemoglobin: 14.5 g/dL (ref 13.0–17.0)
MCH: 29.4 pg (ref 26.0–34.0)
MCHC: 32.6 g/dL (ref 30.0–36.0)
MCV: 90.3 fL (ref 80.0–100.0)
Platelets: 141 K/uL — ABNORMAL LOW (ref 150–400)
RBC: 4.93 MIL/uL (ref 4.22–5.81)
RDW: 12.1 % (ref 11.5–15.5)
WBC: 5.7 K/uL (ref 4.0–10.5)
nRBC: 0 % (ref 0.0–0.2)

## 2024-03-29 LAB — RESP PANEL BY RT-PCR (RSV, FLU A&B, COVID)  RVPGX2
Influenza A by PCR: POSITIVE — AB
Influenza B by PCR: NEGATIVE
Resp Syncytial Virus by PCR: NEGATIVE
SARS Coronavirus 2 by RT PCR: NEGATIVE

## 2024-03-29 LAB — COMPREHENSIVE METABOLIC PANEL WITH GFR
ALT: 21 U/L (ref 0–44)
AST: 34 U/L (ref 15–41)
Albumin: 4.5 g/dL (ref 3.5–5.0)
Alkaline Phosphatase: 64 U/L (ref 38–126)
Anion gap: 12 (ref 5–15)
BUN: 13 mg/dL (ref 6–20)
CO2: 22 mmol/L (ref 22–32)
Calcium: 9 mg/dL (ref 8.9–10.3)
Chloride: 104 mmol/L (ref 98–111)
Creatinine, Ser: 1.04 mg/dL (ref 0.61–1.24)
GFR, Estimated: >60 mL/min
Glucose, Bld: 80 mg/dL (ref 70–99)
Potassium: 4.1 mmol/L (ref 3.5–5.1)
Sodium: 138 mmol/L (ref 135–145)
Total Bilirubin: 0.9 mg/dL (ref 0.0–1.2)
Total Protein: 7 g/dL (ref 6.5–8.1)

## 2024-03-29 MED ORDER — LACTATED RINGERS IV BOLUS
1000.0000 mL | Freq: Once | INTRAVENOUS | Status: AC
  Administered 2024-03-29: 1000 mL via INTRAVENOUS

## 2024-03-29 NOTE — ED Provider Notes (Signed)
 "  [facility] Provider Note   Event Date/Time   First MD Initiated Contact with Patient 03/29/24 1138     (approximate) History  Loss of Consciousness  HPI Edward Castillo is a 48 y.o. male with stated past medical history of umbilical hernia repair who presents complaining of generalized bodyaches, fever, and decreased appetite over the past 3 days.  Patient states that upon getting up from laying down over the last 24 hours he has had 2 episodes of syncope.  Patient states that he only remembers feeling lightheaded and then waking up on the floor.  Patient denies any significant nausea, vomiting, or diarrhea.  Patient does admit to decreased p.o. intake due to decreased appetite ROS: Patient currently denies any vision changes, tinnitus, difficulty speaking, facial droop, sore throat, chest pain, shortness of breath, abdominal pain, nausea/vomiting/diarrhea, dysuria, or numbness/paresthesias in any extremity   Physical Exam  Triage Vital Signs: ED Triage Vitals [03/29/24 1114]  Encounter Vitals Group     BP 110/86     Girls Systolic BP Percentile      Girls Diastolic BP Percentile      Boys Systolic BP Percentile      Boys Diastolic BP Percentile      Pulse Rate 91     Resp 20     Temp 97.9 F (36.6 C)     Temp Source Oral     SpO2 98 %     Weight      Height      Head Circumference      Peak Flow      Pain Score 5     Pain Loc      Pain Education      Exclude from Growth Chart    Most recent vital signs: Vitals:   03/29/24 1114 03/29/24 1200  BP: 110/86 113/73  Pulse: 91 79  Resp: 20 18  Temp: 97.9 F (36.6 C)   SpO2: 98% 100%   General: Awake, oriented x4. CV:  Good peripheral perfusion. Resp:  Normal effort. Abd:  No distention. Other:  Middle-aged well-developed, well-nourished Caucasian male resting comfortably in no acute distress ED Results / Procedures / Treatments  Labs (all labs ordered are listed, but only abnormal results are  displayed) Labs Reviewed  RESP PANEL BY RT-PCR (RSV, FLU A&B, COVID)  RVPGX2 - Abnormal; Notable for the following components:      Result Value   Influenza A by PCR POSITIVE (*)    All other components within normal limits  CBC - Abnormal; Notable for the following components:   Platelets 141 (*)    All other components within normal limits  COMPREHENSIVE METABOLIC PANEL WITH GFR  URINALYSIS, ROUTINE W REFLEX MICROSCOPIC   EKG ED ECG REPORT I, Artist MARLA Kerns, the attending physician, personally viewed and interpreted this ECG. Date: 03/29/2024 EKG Time: 1118 Rate: 93 Rhythm: normal sinus rhythm QRS Axis: normal Intervals: normal ST/T Wave abnormalities: normal Narrative Interpretation: no evidence of acute ischemia RADIOLOGY ED MD interpretation: CT of the head without contrast interpreted by me shows no evidence of acute abnormalities including no intracerebral hemorrhage, obvious masses, or significant edema CT of the cervical spine interpreted by me does not show any evidence of acute abnormalities including no acute fracture, malalignment, height loss, or dislocation - All radiology independently interpreted and agree with radiology assessment Official radiology report(s): CT Cervical Spine Wo Contrast Result Date: 03/29/2024 EXAM: CT CERVICAL SPINE WITHOUT CONTRAST 03/29/2024 12:05:34 PM  TECHNIQUE: CT of the cervical spine was performed without the administration of intravenous contrast. Multiplanar reformatted images are provided for review. Automated exposure control, iterative reconstruction, and/or weight based adjustment of the mA/kV was utilized to reduce the radiation dose to as low as reasonably achievable. COMPARISON: None available. CLINICAL HISTORY: 48 year old male with influenza-like illness, recurrent fainting episodes, and right eye swelling. FINDINGS: BONES AND ALIGNMENT: Relatively normal cervical lordosis. No acute fracture or traumatic malalignment. Suggestion  of levoconvex upper thoracic scoliosis. DEGENERATIVE CHANGES: Mostly anterior cervical spine disc and endplate degeneration. No CT evidence of cervical spinal stenosis. SOFT TISSUES: No prevertebral soft tissue swelling. Non-contrast neck soft tissues are visible. Negative visible non-contrast thoracic inlet aside from right upper lobe azygos fissure (normal variant). IMPRESSION: 1. No acute traumatic injury identified in the cervical spine. Electronically signed by: Helayne Hurst MD 03/29/2024 12:27 PM EST RP Workstation: [id number]   CT Head Wo Contrast Result Date: 03/29/2024 EXAM: CT HEAD WITHOUT CONTRAST 03/29/2024 12:05:34 PM TECHNIQUE: CT of the head was performed without the administration of intravenous contrast. Automated exposure control, iterative reconstruction, and/or weight based adjustment of the mA/kV was utilized to reduce the radiation dose to as low as reasonably achievable. COMPARISON: None available. CLINICAL HISTORY: 48 year old male with influenza-like illness, recurrent fainting episodes, and right eye swelling. FINDINGS: BRAIN AND VENTRICLES: No acute hemorrhage. No evidence of acute infarct. No hydrocephalus. No extra-axial collection. No mass effect or midline shift. Normal brain volume. Normal gray white differentiation. No suspicious intracranial vascular hyperdensity. Normal. ORBITS: Intraorbital soft tissues appear negative. SINUSES: Scattered ethmoid sinus mucosal thickening. Other visible paranasal sinuses, tympanic cavities, and mastoids are well aerated. SOFT TISSUES AND SKULL: Mild right infraorbital and malar space soft tissue swelling and stranding on series 3 image 12. Underlying right maxilla and maxillary sinus appear intact. No skull fracture. IMPRESSION: 1. Right infraorbital soft tissue injury.  Mild paranasal sinus inflammation. 2. Normal for age non-contrast CT appearance of the brain. Electronically signed by: Helayne Hurst MD 03/29/2024 12:25 PM EST RP Workstation:  [id number]   PROCEDURES: Critical Care performed: No Procedures MEDICATIONS ORDERED IN ED: Medications  lactated ringers  bolus 1,000 mL (1,000 mLs Intravenous New Bag/Given 03/29/24 1204)   IMPRESSION / MDM / ASSESSMENT AND PLAN / ED COURSE  I reviewed the triage vital signs and the nursing notes.                             The patient is on the cardiac monitor to evaluate for evidence of arrhythmia and/or significant heart rate changes. Patient's presentation is most consistent with acute presentation with potential threat to life or bodily function. Patient is a 48 year old male with the above-stated past medical history who presents complaining of of viral symptoms including fever, chills, body aches, decreased p.o. intake.  Patient has tested positive for influenza A and is clinically dehydrated.  Patient is feeling much better after liter of LR and no longer feeling orthostatic lightheadedness.  Patient agrees with plan for discharge at this time with outpatient follow-up and expectant management.  Patient given strict return precautions and all questions answered prior to discharge.  Dispo: Discharge home with PCP follow-up   FINAL CLINICAL IMPRESSION(S) / ED DIAGNOSES   Final diagnoses:  Syncope and collapse  Dehydration  Orthostatic syncope  Influenza A virus present   Rx / DC Orders   ED Discharge Orders     None      Note:  This document was prepared using Dragon voice recognition software and may include unintentional dictation errors.   Junior Kenedy K, MD 03/29/24 1331  "

## 2024-03-29 NOTE — ED Triage Notes (Signed)
 Pt to Ed via POV from home. Pt ambulatory to triage. Pt reports flu-like symptoms (aching and fever) on Tuesday. Pt reports yesterday had 2 syncopal episodes. Pt reports pain and swelling under right eye. Pt also reports decreased appetite. No blood thinners.

## 2024-06-25 ENCOUNTER — Encounter: Admitting: Family Medicine
